# Patient Record
Sex: Female | Born: 1960
Health system: Southern US, Community
[De-identification: ages and names within clinical notes are randomized; demographics above are authoritative.]

## PROBLEM LIST (undated history)

## (undated) DIAGNOSIS — C539 Malignant neoplasm of cervix uteri, unspecified: Secondary | ICD-10-CM

## (undated) DIAGNOSIS — F419 Anxiety disorder, unspecified: Secondary | ICD-10-CM

## (undated) HISTORY — PX: LAPAROSCOPIC RADICAL TOTAL HYSTERECTOMY W/ NODE BIOPSY: SHX1934

## (undated) HISTORY — DX: Malignant neoplasm of cervix uteri, unspecified: C53.9

---

## 1999-01-27 ENCOUNTER — Other Ambulatory Visit: Admission: RE | Admit: 1999-01-27 | Discharge: 1999-01-27 | Payer: Self-pay | Admitting: Gynecology

## 1999-03-24 ENCOUNTER — Other Ambulatory Visit: Admission: RE | Admit: 1999-03-24 | Discharge: 1999-03-24 | Payer: Self-pay | Admitting: Gynecology

## 1999-06-22 ENCOUNTER — Ambulatory Visit (HOSPITAL_COMMUNITY): Admission: RE | Admit: 1999-06-22 | Discharge: 1999-06-22 | Payer: Self-pay | Admitting: *Deleted

## 1999-06-22 ENCOUNTER — Encounter: Payer: Self-pay | Admitting: *Deleted

## 1999-07-09 ENCOUNTER — Encounter: Payer: Self-pay | Admitting: *Deleted

## 1999-07-09 ENCOUNTER — Ambulatory Visit (HOSPITAL_COMMUNITY): Admission: RE | Admit: 1999-07-09 | Discharge: 1999-07-09 | Payer: Self-pay | Admitting: *Deleted

## 2000-03-02 ENCOUNTER — Other Ambulatory Visit: Admission: RE | Admit: 2000-03-02 | Discharge: 2000-03-02 | Payer: Self-pay | Admitting: Gynecology

## 2001-03-20 ENCOUNTER — Other Ambulatory Visit: Admission: RE | Admit: 2001-03-20 | Discharge: 2001-03-20 | Payer: Self-pay | Admitting: Gynecology

## 2002-03-21 ENCOUNTER — Other Ambulatory Visit: Admission: RE | Admit: 2002-03-21 | Discharge: 2002-03-21 | Payer: Self-pay | Admitting: Gynecology

## 2002-04-19 ENCOUNTER — Other Ambulatory Visit: Admission: RE | Admit: 2002-04-19 | Discharge: 2002-04-19 | Payer: Self-pay | Admitting: Gynecology

## 2002-06-18 ENCOUNTER — Other Ambulatory Visit: Admission: RE | Admit: 2002-06-18 | Discharge: 2002-06-18 | Payer: Self-pay | Admitting: Gynecology

## 2003-03-20 ENCOUNTER — Other Ambulatory Visit: Admission: RE | Admit: 2003-03-20 | Discharge: 2003-03-20 | Payer: Self-pay | Admitting: Gynecology

## 2003-10-01 ENCOUNTER — Other Ambulatory Visit: Admission: RE | Admit: 2003-10-01 | Discharge: 2003-10-01 | Payer: Self-pay | Admitting: Gynecology

## 2003-12-27 ENCOUNTER — Ambulatory Visit (HOSPITAL_COMMUNITY): Admission: RE | Admit: 2003-12-27 | Discharge: 2003-12-27 | Payer: Self-pay | Admitting: Gynecology

## 2004-04-08 ENCOUNTER — Other Ambulatory Visit: Admission: RE | Admit: 2004-04-08 | Discharge: 2004-04-08 | Payer: Self-pay | Admitting: Gynecology

## 2005-02-02 ENCOUNTER — Encounter: Admission: RE | Admit: 2005-02-02 | Discharge: 2005-02-02 | Payer: Self-pay | Admitting: Gastroenterology

## 2005-04-19 ENCOUNTER — Encounter (INDEPENDENT_AMBULATORY_CARE_PROVIDER_SITE_OTHER): Payer: Self-pay | Admitting: *Deleted

## 2005-04-19 ENCOUNTER — Ambulatory Visit (HOSPITAL_COMMUNITY): Admission: RE | Admit: 2005-04-19 | Discharge: 2005-04-19 | Payer: Self-pay | Admitting: Gastroenterology

## 2005-05-05 ENCOUNTER — Other Ambulatory Visit: Admission: RE | Admit: 2005-05-05 | Discharge: 2005-05-05 | Payer: Self-pay | Admitting: Gynecology

## 2006-01-26 ENCOUNTER — Ambulatory Visit: Payer: Self-pay | Admitting: Family Medicine

## 2006-03-01 HISTORY — PX: ABDOMINAL HYSTERECTOMY: SHX81

## 2006-07-04 ENCOUNTER — Other Ambulatory Visit: Admission: RE | Admit: 2006-07-04 | Discharge: 2006-07-04 | Payer: Self-pay | Admitting: Gynecology

## 2006-08-09 ENCOUNTER — Other Ambulatory Visit: Admission: RE | Admit: 2006-08-09 | Discharge: 2006-08-09 | Payer: Self-pay | Admitting: Gynecology

## 2006-10-19 ENCOUNTER — Ambulatory Visit: Admission: RE | Admit: 2006-10-19 | Discharge: 2006-10-19 | Payer: Self-pay | Admitting: Gynecology

## 2006-10-21 ENCOUNTER — Inpatient Hospital Stay (HOSPITAL_COMMUNITY): Admission: RE | Admit: 2006-10-21 | Discharge: 2006-10-25 | Payer: Self-pay | Admitting: Gynecology

## 2006-10-21 ENCOUNTER — Encounter (INDEPENDENT_AMBULATORY_CARE_PROVIDER_SITE_OTHER): Payer: Self-pay | Admitting: Gynecology

## 2006-11-18 ENCOUNTER — Ambulatory Visit: Admission: RE | Admit: 2006-11-18 | Discharge: 2006-11-18 | Payer: Self-pay | Admitting: Gynecology

## 2006-12-23 ENCOUNTER — Ambulatory Visit (HOSPITAL_COMMUNITY): Admission: RE | Admit: 2006-12-23 | Discharge: 2006-12-23 | Payer: Self-pay | Admitting: Gynecology

## 2006-12-29 ENCOUNTER — Other Ambulatory Visit: Admission: RE | Admit: 2006-12-29 | Discharge: 2006-12-29 | Payer: Self-pay | Admitting: Gynecology

## 2007-01-20 IMAGING — RF DG ESOPHAGUS
8 series · 20 of 24 positions shown · non-contrast
Comparison: None.

CLINICAL DATA: Dysphagia.
 BARIUM ESOPHAGRAM:

[Series 1: run · 5 of 7 slices shown (1 of 8)]
[im 1/7]
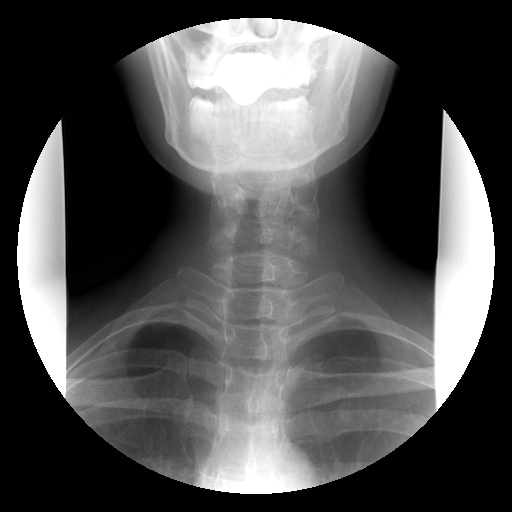
[im 2/7]
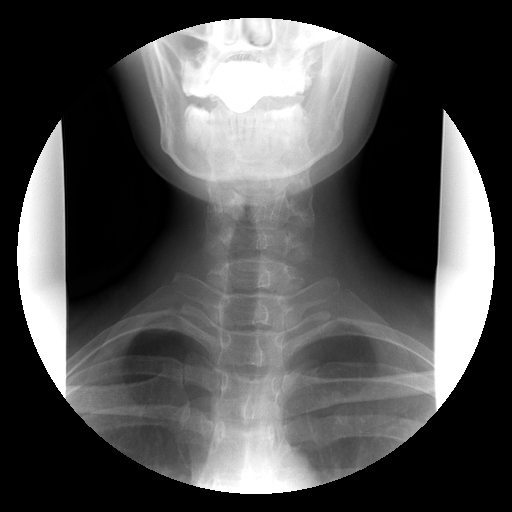
[im 4/7]
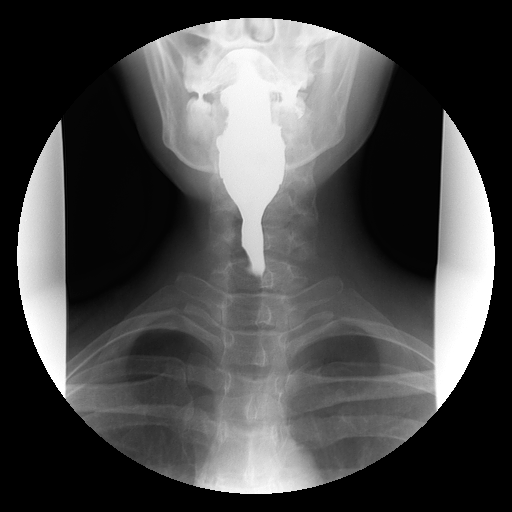
[im 5/7]
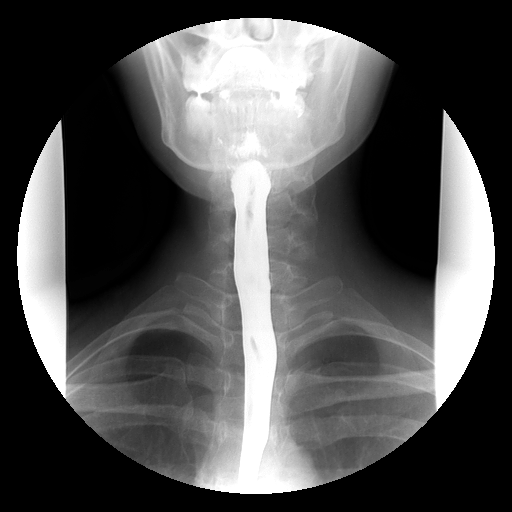
[im 7/7]
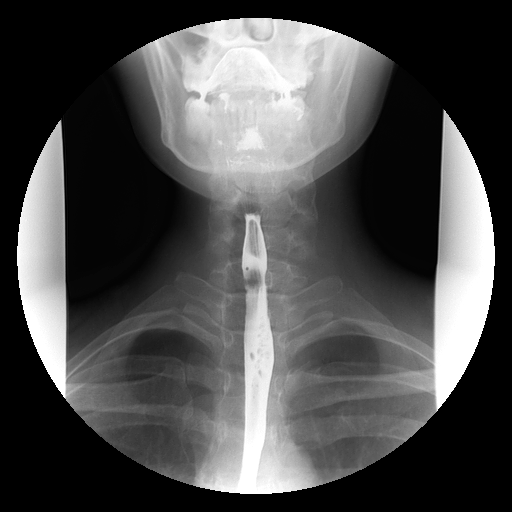

[Series 2: run · 5 of 8 slices shown (2 of 8)]
[im 1/8]
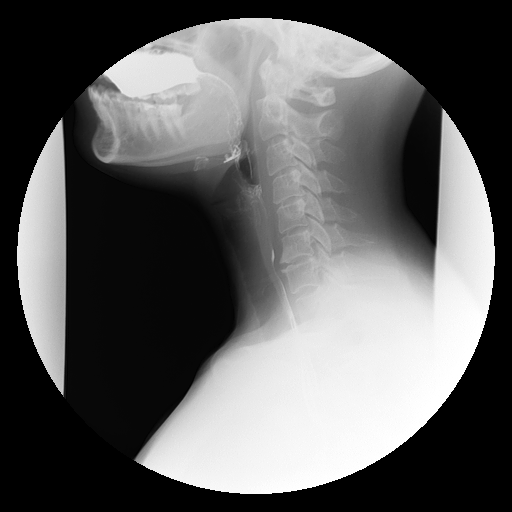
[im 2/8]
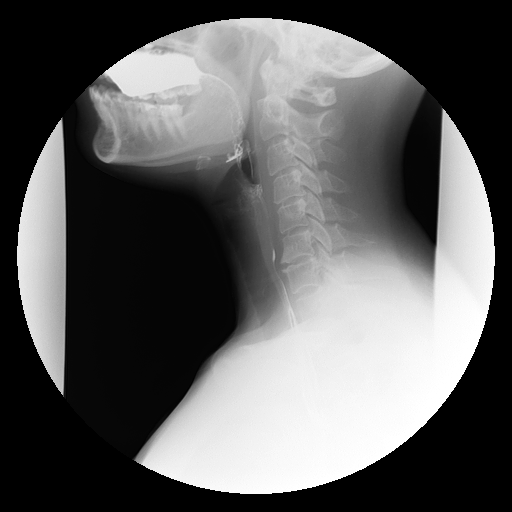
[im 5/8]
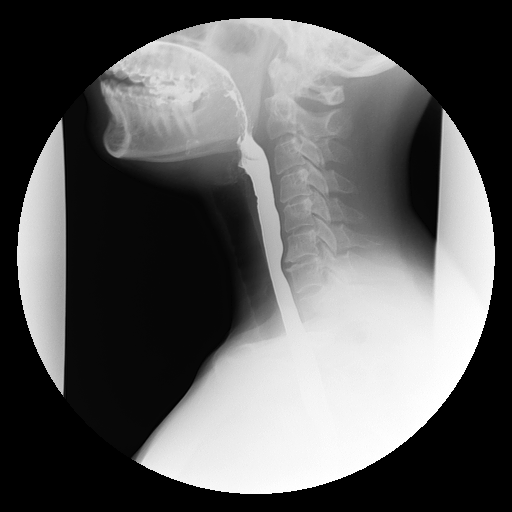
[im 6/8]
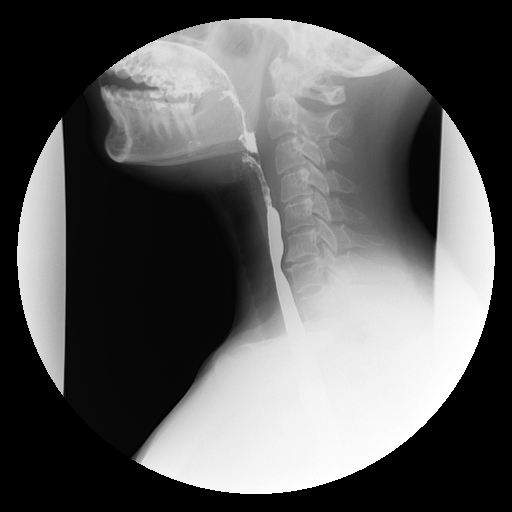
[im 8/8]
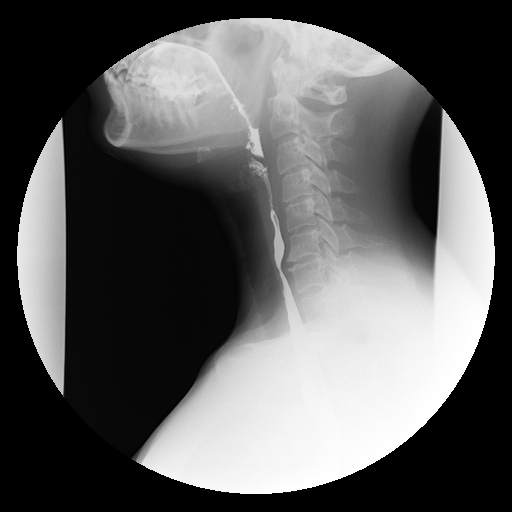

[Series 3: run · 3 of 5 slices shown (3 of 8)]
[im 1/5]
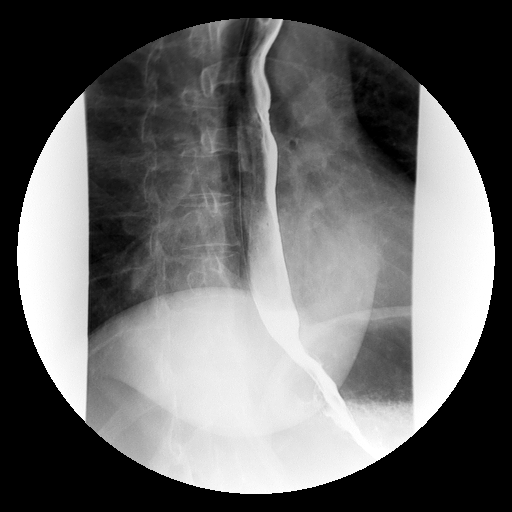
[im 2/5]
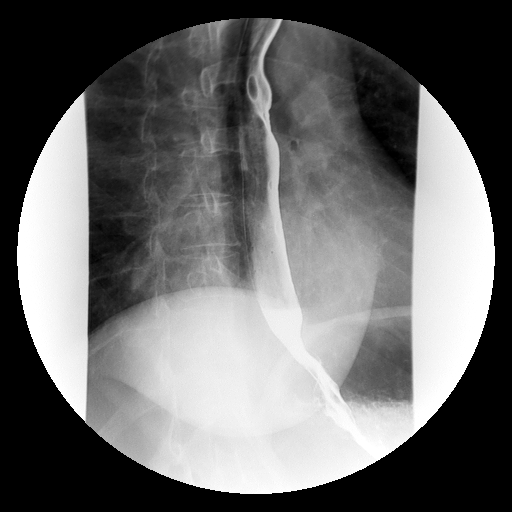
[im 5/5]
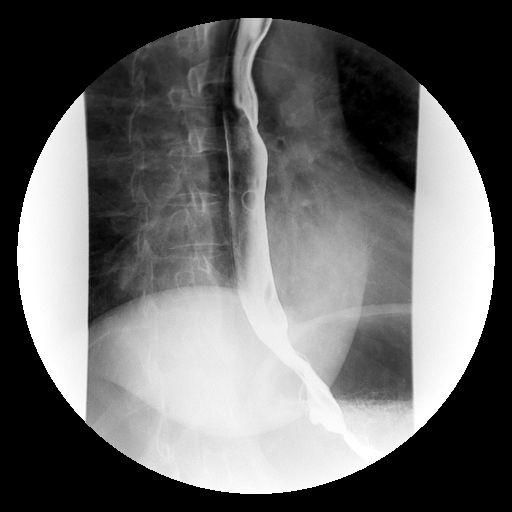

[Series 4: run · 2 of 3 slices shown (4 of 8)]
[im 1/3]
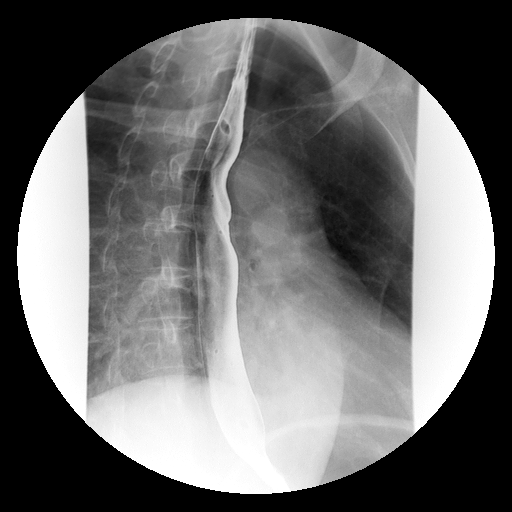
[im 3/3]
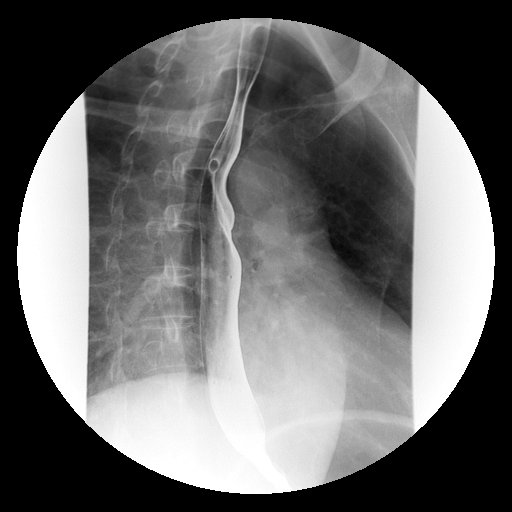

[Series 5: run · 2 of 4 slices shown (5 of 8)]
[im 1/4]
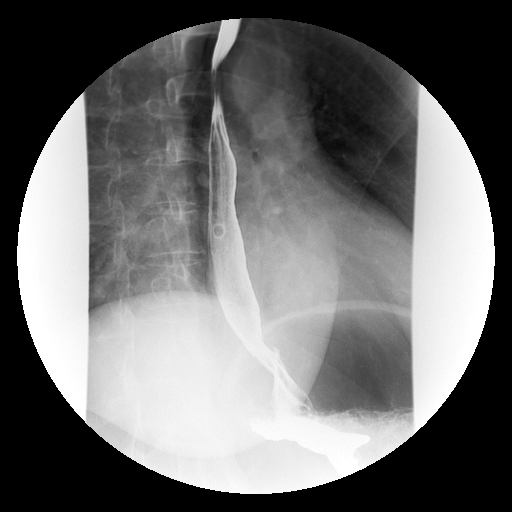
[im 2/4]
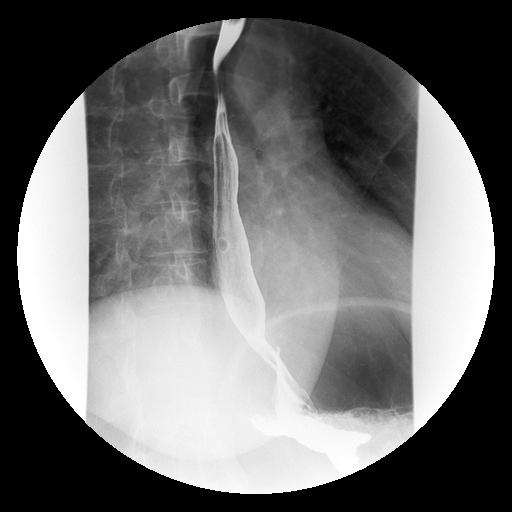

[Series 6: run · 1 of 1 slices shown (6 of 8)]
[im 1/1]
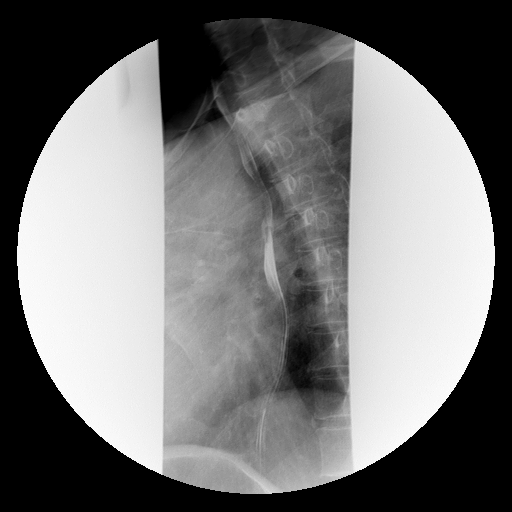

[Series 7: run · 1 of 1 slices shown (7 of 8)]
[im 1/1]
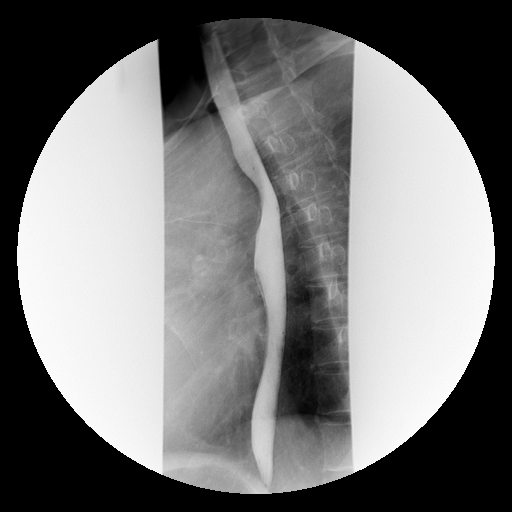

[Series 8: run · 1 of 1 slices shown (8 of 8)]
[im 1/1]
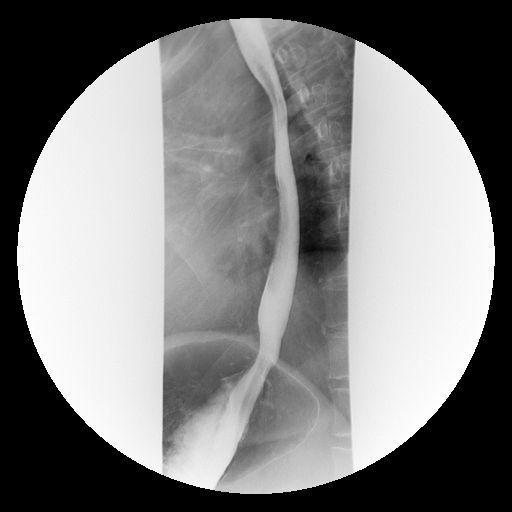

[20 of 24 positions shown; findings below may reference images not displayed]

FINDINGS: Double contrast barium esophagram shows no evidence for mucosal ulceration, stricture, or diverticulum.  Patient was noted to have anterior spurs around the C6-7 disk and these generate mass effect on the posterior esophagus at this level.  No evidence for hiatal hernia.  No esophageal fold thickening to suggest esophagitis.  
 RAO prone positioning demonstrates normal esophageal motility.
IMPRESSION: Anterior spurs at the C5-6 level generate mild mass effect on the posterior esophagus, but the exam is otherwise normal.

## 2008-10-05 IMAGING — CR DG CHEST 2V
2 series · 2 of 2 positions shown · non-contrast
Comparison: none
 The lungs are clear and the heart and mediastinal structures are normal.

CLINICAL DATA: Cervical cancer.  Preoperative chest.  
 [id number] VIEWS:

[view not recorded (1 of 2)]
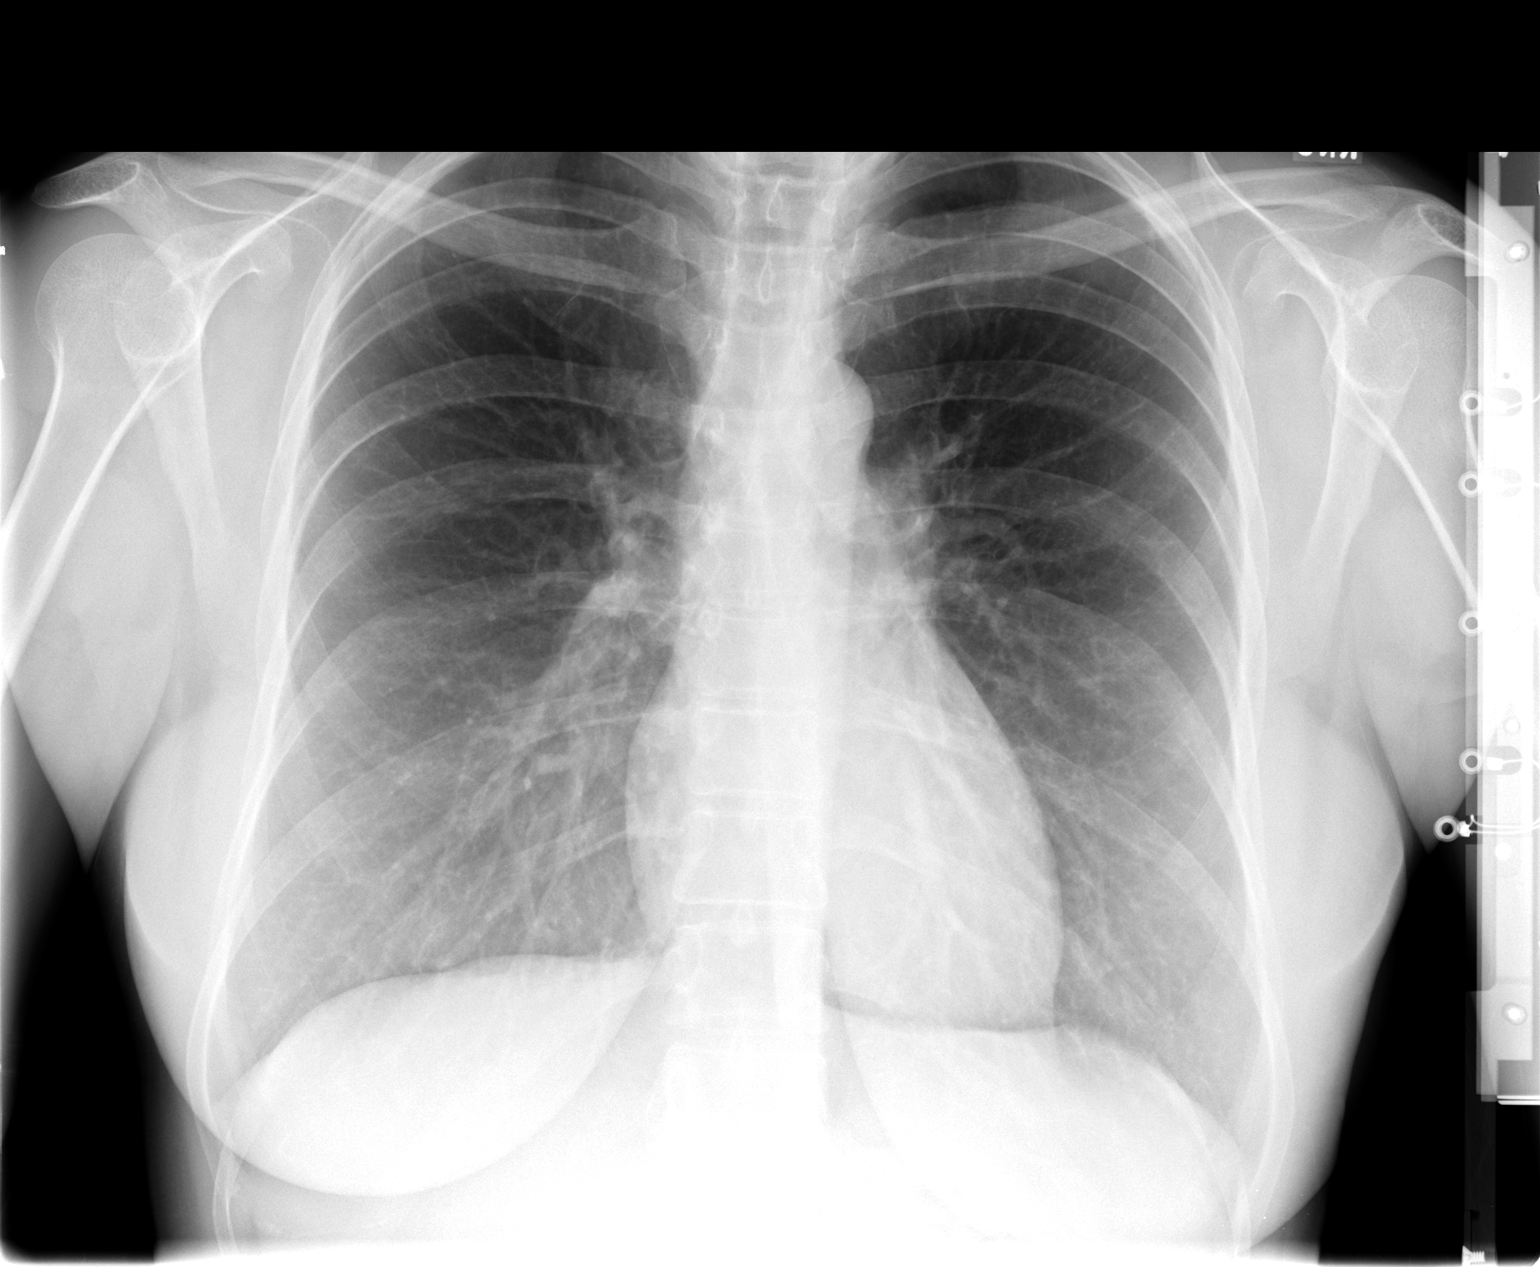

[view not recorded (2 of 2)]
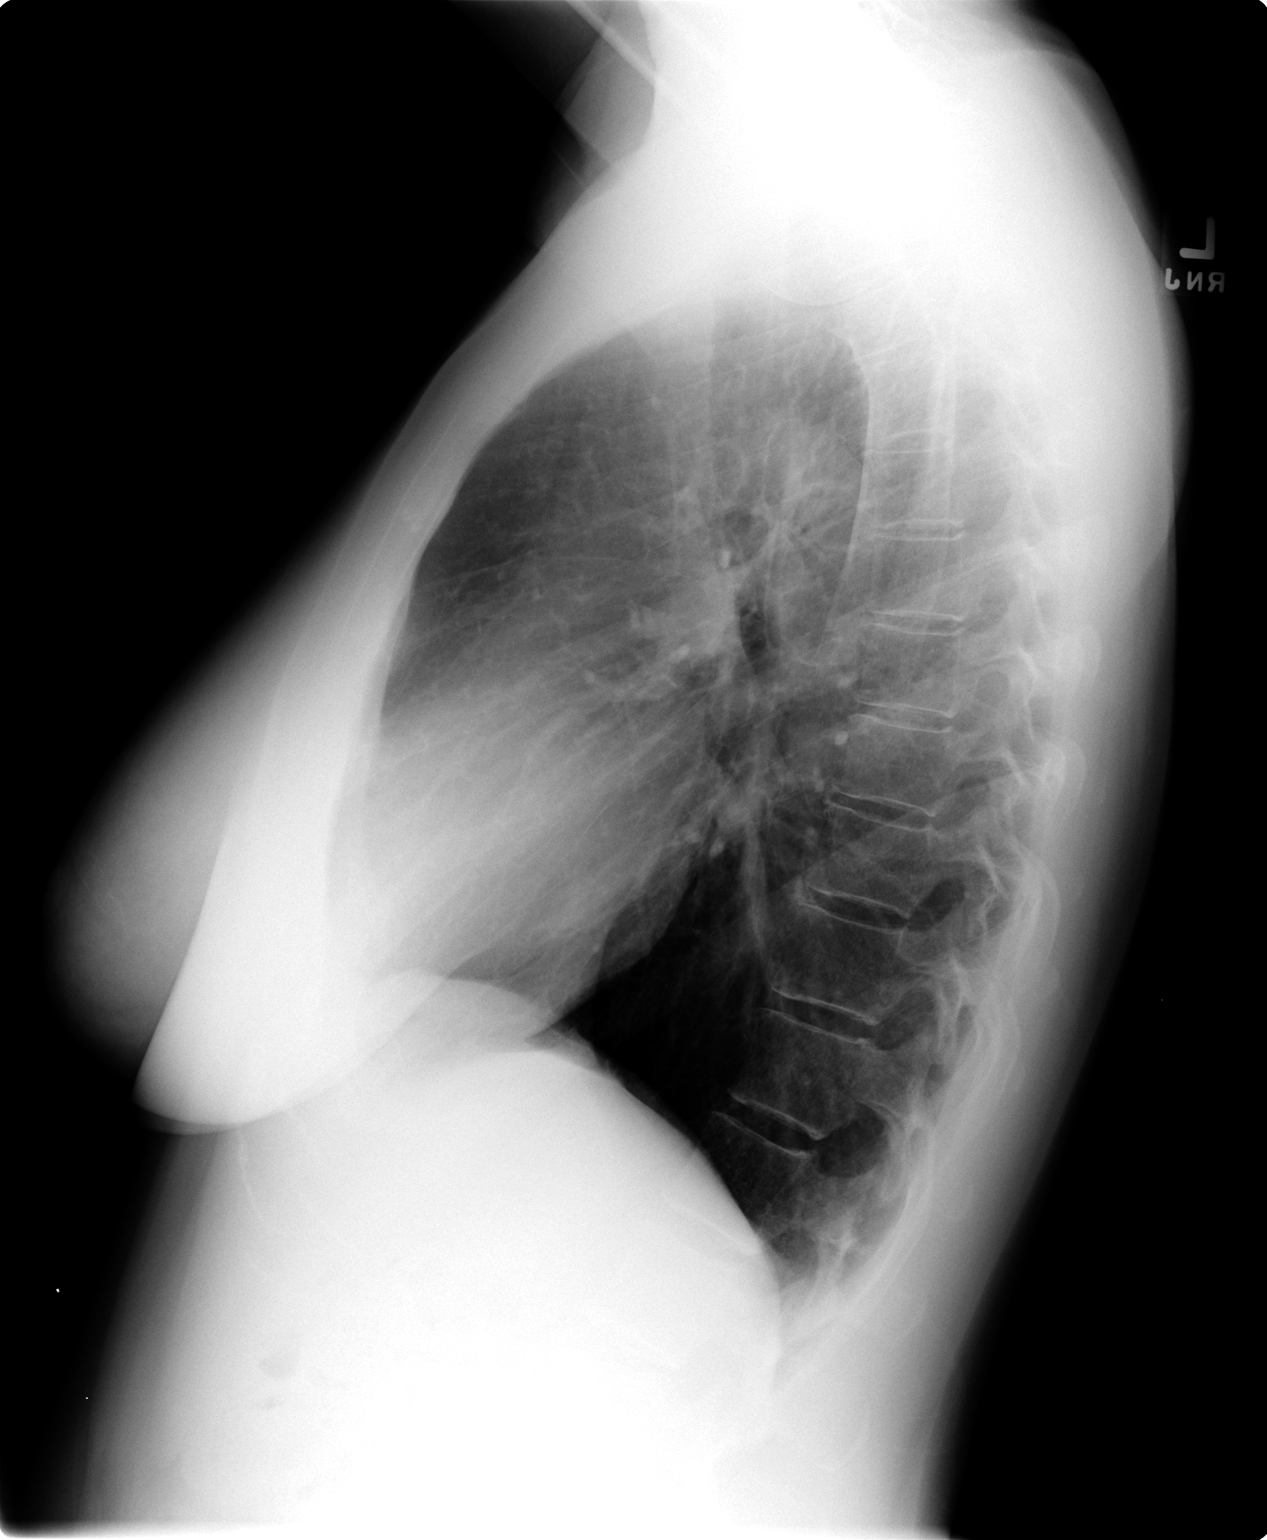

[2 of 2 positions shown; findings below may reference images not displayed]

IMPRESSION: No evidence for active chest disease.

## 2009-05-02 ENCOUNTER — Ambulatory Visit (HOSPITAL_COMMUNITY): Admission: RE | Admit: 2009-05-02 | Discharge: 2009-05-02 | Payer: Self-pay | Admitting: Gynecology

## 2010-07-14 NOTE — Consult Note (Signed)
[name], [name]                 ACCOUNT NO.:  [id number]   MEDICAL RECORD NO.:  [id number]          PATIENT TYPE:  OUT   LOCATION:  GYN                          FACILITY:  [facility]   PHYSICIAN:  De Blanch, M.D.DATE OF BIRTH:  1961-01-02   DATE OF CONSULTATION:  10/19/2006  DATE OF DISCHARGE:                                 CONSULTATION   CHIEF COMPLAINT:  Cervical cancer.   HISTORY OF PRESENT ILLNESS:  The patient underwent a LEEP procedure on  September 19, 2006 for evaluation of a high-grade dysplastic Pap smear and  biopsies.  Final pathology on the LEEP procedure showed an invasive  squamous cell carcinoma (stage IA1). She had 0.2 cm of width and 0.07 cm  in maximal depth.  Surprisingly there was lymph vascular space invasion  despite this very superficially invasive lesion.  The patient has had an  uncomplicated postoperative course and presents today for further  consultation and treatment planning.   PAST MEDICAL HISTORY:  Medical illnesses none.   PAST SURGICAL HISTORY:  Arthroscopic reconstruction of her right knee  (ACL injury).   DRUG ALLERGIES:  None.   CURRENT MEDICATIONS:  Paxil, Femcon and multivitamins.   OBSTETRICAL HISTORY:  Gravida 25, 75 year old child.   SOCIAL HISTORY:  The patient is single.  She does not smoke.  She works  for an [employer] in [employer].   FAMILY HISTORY:  Negative for gynecologic, breast or colon cancer.   PHYSICAL EXAM:  Weight 154 pounds, blood pressure 110/70, pulse 80,  respiratory 20.  GENERAL:  The patient is a healthy white female in no acute distress.  HEENT:  Negative.  NECK:  Supple without thyromegaly.  There is no supraclavicular or  inguinal adenopathy.  ABDOMEN:  The abdomen is soft, nontender.  No mass, organomegaly,  ascites or hernias are noted.  PELVIC:  EGBUS, vagina, bladder, urethra are normal. The cervix is  healing well. There is a small ectropion present, no evidence of  infection or  purulent discharge is noted. On bimanual examination, the  uterus is anterior, normal shape, size and consistency. There is no  adnexal masses noted.  Rectovaginal exam confirms.  I do not feel any  parametrial involvement.   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  This is a most unusual lesion. Given its  superficial nature, it is surprising there is involvement of lymph  vascular spaces this close to the cervical mucosa.  Nonetheless, I  believe she has a higher risk of having lymph node metastases and  therefore in consultation with Dr. Nicholas [name] we have agreed to treat the  patient with a total abdominal hysterectomy, bilateral salpingo-  oophorectomy and selective pelvic lymphadenectomy.  If the disease is  confined to the cervix or there is no residual disease, I do not believe  any adjuvant therapy would be necessary or advised.  The patient also  understands that if a lymphadenectomy reveals metastatic disease, she  will need to be treated with postoperative radiation therapy. I think  the odds of this  are very unlikely.  The risks of surgery including  hemorrhage, infection, injury to adjacent viscera, thromboembolic  complications and anesthetic risks were outlined to the patient. She is  aware of the potential for genital femoral nerve injury or numbness  beneath a Pfannenstiel incision. All of her questions are answered and  she wishes to go ahead with surgery which was scheduled for this Friday.  She will undergo preoperative evaluation today.      De Blanch, M.D.  Electronically Signed     DC/MEDQ  D:  10/19/2006  T:  10/20/2006  Job:  [id number]   cc:   [name], M.D.  Fax: [phone]   [name], R.N.  [address]  [place], [place] [address]

## 2010-07-14 NOTE — Consult Note (Signed)
[name], [name]                 ACCOUNT NO.:  [id number]   MEDICAL RECORD NO.:  [id number]          PATIENT TYPE:  OUT   LOCATION:  GYN                          [facility]ACILITY[facility]   PHYSICIAN:  De Blanch, M.D.DATE OF BIRTH:  [birth year]   DATE OF CONSULTATION:  DATE OF DISCHARGE:                                 CONSULTATION   CHIEF COMPLAINT:  Postoperative followup.   INTERVAL HISTORY:  The patient underwent a total abdominal hysterectomy  and pelvic lymphadenectomy on October 21, 2006, for treatment of a stage  I A1 squamous cell carcinoma of the cervix with lymph vascular space  invasion.  Final pathology showed no residual disease in the cervix and  all lymph nodes were free of disease.  The patient has had an  uncomplicated postoperative course except for some vulvar and lower  abdomen fluid retention which is now resolved.  The patient saw Dr.  Nicholas Lose yesterday.  She reports that over the past week she has improved  significantly in all of her symptoms.  She has no GI or GU symptoms at  the present time.  Her functional status is improving steadily.   PHYSICAL EXAMINATION:  VITAL SIGNS:  Weight 150 pounds.  ABDOMEN:  Soft and nontender.  Pfannenstiel incision is healing nicely.  There is some postoperative induration as would be expected.   Given the fact the patient had a pelvic exam yesterday by Dr. Nicholas Lose, it  will not be repeated today.   IMPRESSION:  I reviewed the patient's pathology report with her.  It  indicated that the prognosis is excellent and the risk of recurrence is  significantly small.   I would suggest she be followed by Dr. Nicholas Lose with a Pap smear at every  visit, explaining that the risk of recurrence in the vagina could be  detected by performing Pap smears.  She has a previously scheduled  appointment with Dr. Nicholas Lose in 2 months and then I would suggest that she  be seen every 3-4 months for the 1st year of followup and every 6 months  thereafter.  I would be happy to see the patient back if she has any  difficulties or problems.      De Blanch, M.D.  Electronically Signed     DC/MEDQ  D:  11/18/2006  T:  11/18/2006  Job:  [id number]   cc:   Gretta Cool, M.D.  Fax: [phone]   Telford Nab, R.N.  [address]  [place], [place] [address]

## 2010-07-14 NOTE — H&P (Signed)
[name], [name]                 ACCOUNT NO.:  [id number]   MEDICAL RECORD NO.:  [id number]          PATIENT TYPE:  INP   LOCATION:  NA                           FACILITY:  [facility]   PHYSICIAN:  Gretta Cool, M.D. DATE OF BIRTH:  01/21/1961   DATE OF ADMISSION:  10/19/2006  DATE OF DISCHARGE:                              HISTORY & PHYSICAL   CHIEF COMPLAINT:  Invasive carcinoma of the cervix, stage IA1 with 2-mm  width the invasion and less than 1-mm depth of invasion but with  significant microvascular invasion.   HISTORY OF PRESENT ILLNESS:  A 50 year old G1, P1, admitted for  definitive therapy of stage IA1 with microinvasion as above.  Although  she has very scant extent of disease, there is significant risk of node  involvement.  I have arranged definitive therapy with Dr. Katheren Shams-  [facility] for [facility]On[facility] at [facility] for definitive therapy by  hysterectomy and pelvic lymphadenectomy.  She has had normal Pap smears  on an annual basis since 2004.  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  She has had 3 normal Pap smears in a row following negative  workup.  And, then normal Paps on an annual basis since that time.  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  She had colposcopy and  directed biopsies and had a high grade squamous intraepithelial lesion  CIN-3.  She subsequently had LEEP excision of the entire transformation  zone.  All of the margins were clear.  She had on LEEP cone a 2-mm width  of invasion and 0.7-mm depth of invasion.  It was supposed she had  significant microvascular involvement.  She is now admitted for  definitive therapy as planned.  I have reviewed the pathology with Dr.  Sherryll Burger and agree that she needs more than simple hysterectomy.  She is now  admitted for management with Dr. Serita Kyle.   PAST MEDICAL HISTORY:  Usual childhood disease without  sequelae.   MEDICAL ILLNESSES:  None.   PREVIOUS HOSPITALIZATION:  1. Knee surgery.  2. Vaginal delivery x1.   PRESENT MEDICATIONS:  Hormonal contraception.   FAMILY HISTORY:  Father has type 2 diabetes.  Paternal grandfather had  lung cancer.  Paternal grandmother liver cancer.  Father also had  hypertension and no other known familial tendency.   HABITS:  Denies ethanol, tobacco, recreational drugs.   SOCIAL HISTORY:  The patient is a Psychologist, forensic.  She is divorced, has  a teenage child at home.  She has good family support system.   REVIEW OF SYSTEMS:  HEENT:  Denies symptoms.  CARDIORESPIRATORY:  Denies  asthma, cough, bronchitis, shortness of breath. GI/GU:  Denies  frequency, urgency, dysuria, change in bowel habits, food intolerance.   PHYSICAL EXAMINATION:  GENERAL:  Well developed, well nourished, white  female.  VITAL SIGNS:  Height 5 feet 6 inches, weight 150 pounds.  Blood pressure  is 120/78.  HEENT:  Pupils equal reactive to light, accommodate.  Fundi not  examined.  Oropharynx clear.  NECK:  Supple without mass, thyroid enlargement.  CHEST:  Clear to percussion auscultation.  HEART:  Regular rhythm without murmur or cardiac enlargement.  BREASTS:  Soft without mass, nodes, or nipple discharge.  ABDOMEN:  Soft, scaphoid without mass, organomegaly.  PELVIC:  External genitalia normal female.  Vagina clean, rugose.  The  cervix is parous without visible lesions prior to LEEP excision.  Her  cone is well healed.  Uterus is normal size, shape, contour.  Adnexa  clear.  Rectovaginal confirms.   IMPRESSION:  1. Invasive squamous cell carcinoma of the cervix, stage IA1 with      significant vascular invasion.  2. Normal Pap smears proceeding this high grade change.           ______________________________  Gretta Cool, M.D.     CWL/MEDQ  D:  10/20/2006  T:  10/20/2006  Job:  [id number]

## 2010-07-14 NOTE — Op Note (Signed)
[name], [name]                 ACCOUNT NO.:  [id number]   MEDICAL RECORD NO.:  [id number]          PATIENT TYPE:  INP   LOCATION:  1537                         FACILITY:  [facility]   PHYSICIAN:  De Blanch, M.D.DATE OF BIRTH:  10/11/1960   DATE OF PROCEDURE:  10/21/2006  DATE OF DISCHARGE:  10/19/2006                               OPERATIVE REPORT   PREOPERATIVE DIAGNOSIS:  Stage I-A squamous cell carcinoma of the cervix  with lymph vascular space invasion.   POSTOPERATIVE DIAGNOSIS:  Stage I-A squamous cell carcinoma of the  cervix with lymph vascular space invasion.   PROCEDURE:  Total abdominal hysterectomy, pelvic lymphadenectomy.   SURGEON:  De Blanch, M.D.   ASSISTANT:  Gretta Cool, M.D.  Telford Nab, R.N.   ANESTHESIA:  General with endotracheal tube.   ESTIMATED BLOOD LOSS:  100 mL.   SURGICAL FINDINGS:  On exploratory laparotomy, the uterus, tubes, and  ovaries appeared normal.  There were no enlarged lymph nodes.  The upper  abdomen was normal.  Once the uterus was removed, the cervix appeared  normal grossly and was not enlarged and had healed well from the prior  LEEP procedure.   DESCRIPTION OF PROCEDURE:  The patient was brought to the operating room  and after satisfactory attainment of general anesthesia, was placed in a  modified lithotomy position in Rodeo stirrups.  The anterior abdominal  wall, perineum, and vagina were prepped with Betadine.  A Foley catheter  was inserted and the patient was draped.  The abdomen was entered  through a Pfannenstiel incision.  The upper abdomen and pelvis were  explored with the above noted findings.  The Bookwalter retractor was  positioned and the bowel was packed out of the pelvis.   The uterus was grasped with long Kelly clamps.  The round ligaments were  divided and the peritoneum incised lateral to the infundibulopelvic  ligament.  The retroperitoneal spaces were opened identifying  the  vessels and the ureter.  The ovarian vessels were identified. The  ovarian ligament and fallopian tube were cross clamped adjacent to the  cornua of the uterus, divided, free tied, and suture ligated thus  preserving the tubes and ovaries.  These were packed behind the  retractors.  The bladder flap was advanced with sharp and blunt  dissection.  The uterine vessels were skeletonized, clamped, cut and  suture ligated.  The rectovaginal septum was opened, the paracervical  and paravaginal tissues were clamped, cut, and suture ligated.  The  vaginal angles were identified, cross clamped, and divided, and the  vagina transected just beneath the cervix.  The cervix is inspected and  found to be entirely removed.  The vaginal angles were closed with  transfixing sutures of 0 Vicryl and the central portion of the vagina  closed with interrupted figure-of-eight sutures of 0 Vicryl.   Attention was turned to performing the pelvic lymphadenectomy.  Lymphadenectomy was performed because of the small possibility of attic  metastases based on pathologic findings of lymph vascular space invasion  in the cervical specimen.  The lymph nodes from the  external iliac  artery and vein were removed from the circumflex iliac vein up to the  bifurcation of the common iliac vein.  Dissection was then carried down  along the medial aspect of the external iliac vein into the obturator  fossa.  Lymph nodes from the obturator fossa were then removed.  The  obturator nerve was identified and mobilized laterally so as to  facilitate a lymph node dissection.  Throughout the dissection on both  sides of the pelvis, care was taken to preserve the genitofemoral nerve,  as well.  On the right side, the genitofemoral nerve seemed to split  into two branches, one branch overlying the external iliac artery.  This  was carefully dissected and pulled laterally throughout the remainder of  the lymph node dissection.    Hemostasis throughout the lymph node dissection was achieved with  hemoclips and cautery.  The pelvis was irrigated with warm saline and  found to be hemostatic.  The ovaries were then sutured to the round  ligament and lateral pelvic sidewall, thus keeping them from falling  deep into the pelvis and potentially causing dyspareunia.  Packs and  retractors were removed.  The anterior abdominal wall was closed in  layers, the first being running 2-0 Vicryl suture on the peritoneum, the  fascia was closed with a running suture of 0 PDS, the subcutaneous  tissue was reapproximated with 2-0 PDS, the skin was closed with skin  staples.  A dressing was applied.  The patient was awakened from  anesthesia and taken to the recovery room in satisfactory condition.  Sponge, needle, and instrument counts were correct x2.      De Blanch, M.D.  Electronically Signed     DC/MEDQ  D:  10/21/2006  T:  10/22/2006  Job:  [id number]   cc:   Gretta Cool, M.D.  Fax: [phone]   Telford Nab, R.N.  [address]  [place], [place] [address]

## 2010-07-17 NOTE — Op Note (Signed)
[name], [name]                 ACCOUNT NO.:  [id number]   MEDICAL RECORD NO.:  [id number]          PATIENT TYPE:  AMB   LOCATION:  ENDO                         FACILITY:  [facility]   PHYSICIAN:  Anselmo Rod, M.D.  DATE OF BIRTH:  Jan 17, 1961   DATE OF PROCEDURE:  04/19/2005  DATE OF DISCHARGE:                                 OPERATIVE REPORT   PROCEDURE PERFORMED:  Esophagogastroduodenoscopy with small bowel biopsies.   ENDOSCOPIST:  Anselmo Rod, M.D.   INSTRUMENT USED:  Olympus video panendoscope.   INDICATION FOR PROCEDURE:  [age]-year-old white female with a history  of dysphagia, undergoing an EGD as she continues to have difficulty  swallowing at times in attacks, in spite of the use of PPIs.  A barium  swallow showed a mass effect in the posterior esophagus, ? anterior spurs at  C5-C6 level.  The patient has a history of acid reflux and has had  epigastric pain.   PREPROCEDURE PREPARATION:  Informed consent was procured from the patient.  The patient was fasted for 8 hours prior to the procedure.  Risks and  benefits of the procedure were discussed with the patient in great detail.   PREPROCEDURE PHYSICAL:  VITAL SIGNS:  The patient had stable vital signs.  NECK:  Supple.  CHEST:  Clear to auscultation.  CARDIAC:  S1 and S2 regular.  ABDOMEN:  Soft with normal bowel sounds.   DESCRIPTION OF PROCEDURE:  The patient was placed in the left lateral  decubitus position and sedated with 75 mcg of fentanyl and 8 mg of Versed in  slow incremental doses.  Once the patient was adequately sedated and  maintained on low-flow oxygen and continuous cardiac monitoring, the Olympus  video panendoscope was advanced through the mouthpiece, over the tongue,  into the esophagus under direct vision.  The entire esophagus was widely  patent with no evidence of ring, stricture, mass, esophagitis or Barrett's  mucosa.  The scope was then advanced into the stomach and a small  hiatal  hernia was seen on high retroflexion.  The rest of the gastric mucosa  appeared normal.  There were no ulcers, erosions, masses or polyps seen and  on advancing the scope into the proximal small bowel, blunted mucosa was  noted with whitish patches; this was biopsied to rule out celiac sprue.  The  patient tolerated the procedure well without complications.   IMPRESSION:  1.Widely patent esophagus, no abnormalities noted.  2.Normal-appearing stomach.  3.Small hiatal hernia seen.  4.Whitish patches in the proximal small bowel, biopsies done to rule out  sprue.   RECOMMENDATIONS:  1.Await pathology results.  2.Continue PPIs for now.  3.Avoid all nonsteroidals for now.  4.Further recommendations will depend on biopsy results.  5.An esophageal manometry will be done if her dysphagia persists.  6.Avoidance of very hot or very cold liquids has been advised to prevent  esophageal spasm.      Anselmo Rod, M.D.  Electronically Signed     JNM/MEDQ  D:  04/19/2005  T:  04/20/2005  Job:  [id number]   cc:  [name], M.D.  Fax: [phone]   [name], M.D.  Fax: [phone]

## 2010-07-17 NOTE — Discharge Summary (Signed)
[name], [name]                 ACCOUNT NO.:  [id number]   MEDICAL RECORD NO.:  [id number]          PATIENT TYPE:  INP   LOCATION:  1537                         FACILITY:  [facility]   PHYSICIAN:  Gretta Cool, M.D. DATE OF BIRTH:  [birth year]   DATE OF ADMISSION:  10/21/2006  DATE OF DISCHARGE:  10/25/2006                               DISCHARGE SUMMARY   HISTORY OF PRESENT ILLNESS:  [name] is a 50 year old female, gravida  1, para 1, who was admitted for definitive therapy, stage A1  with  microinvasion of the cervix.  Although she has scant extent of the  disease, there was a significant risk of node involvement.  By pathology  report, she has invasive carcinoma of the cervix, stage A1 with 2 mm  width of invasion and less than 1 mm depth of invasion but with  significant microvascular invasion.  Consultation with Dr. Katheren Shams-  Pearson was arranged, and surgery will be done with him.  She will have  definitive therapy by hysterectomy and pelvic lymphadenectomy.  She has  had normal Pap smears on annual exam since 2004.  That year, she had  glandular cell abnormality.  Since that time, she has had three normal  Pap smears in a row with negative workup.  On Jul 04, 2006, the Pap smear  showed glandular cell abnormality again.  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  Colposcopy-directed  biopsies showed high grade squamous intra-epithelial lesions, CIN-III.  She subsequently had a LEEP excision of the entire transformation zone,  and all margins were clear.  On LEEP, she had a 2 mm width of invasion  and a 0.7 mm depth of invasion.  It was supposed at that time that she  had significant microvascular involvement, and now is admitted for  definitive therapy.  Pathology was reviewed with Dr. Clelia Croft, and it was  agreed that she needed more than simple hysterectomy.  She is now  admitted for management with Dr. De Blanch.   ADMISSION  EXAMINATION:  Well-developed and well-nourished white female.  HEENT:  Without findings.  Thyroid was not enlarged.  CHEST:  Clear to A&P.  HEART:  Rate and rhythm were regular without murmur, gallop, or cardiac  enlargement.  BREASTS:  Soft without masses, nodes, or nipple discharge.  ABDOMEN:  Soft and scaphoid without masses or organomegaly.  PELVIC:  External genitalia within normal limits for female.  Vagina was  clean and rugose.  Cervix is parous without visible lesions prior to  LEEP excision.  This is well healed.  The uterus is normal in shape,  size, and contour.  The adnexa is bilaterally clear.  The rectovaginal  exam confirms.   IMPRESSION:  1. 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  2. Normal Pap smears proceeding this high-grade change, cervical      intraepithelial neoplasia III with invasion.   PLAN:  As mentioned above.   LABS:  Preoperative labs:  Hemoglobin was 13.4, hematocrit 38.9.  The  remainder of her preoperative labs were within normal limits with the  exception of a slightly elevated potassium or an elevated potassium at  5.4.  Her postoperative labs, hemoglobin 11.4, hematocrit 32.9.  Her CO2  was 34.  Preop urinary pregnancy test was negative.  Her ABO Rh is A+  with a negative antibody screen.   CHEST X-RAY:  Lungs were clear.  Heart and mediastinal structures are  normal.   HOSPITAL COURSE:  Patient underwent abdominal hysterectomy with pelvic  lymph node dissection under general anesthesia.  The procedure was  performed by Dr. De Blanch and Dr. Beather Arbour.  The  procedures were completed without any complication.  The patient was  returned to the recovery room in excellent condition.  Pathology report  showed the cervix with biopsy site changes and no residual carcinoma or  squamous intraepithelial lesion.  Benign weekly secretory endometrium  and focal adenomyosis.  Left  pelvis lymph nodes, no tumor identified in  four nodes.  The right pelvic lymph node excision, no tumor identified  in eight nodes.   Her postoperative course was complicated by a lot of itching with the  PCA Dilaudid.  That medication was changed.  Her itching resolved.  She  also complained of abdominal cramping.  A Dulcolax suppository was given  with relief.  It did require a Fleet enema for complete relief.   On the following day, she again had difficulty with abdominal distention  and gas.  She again received a Fleet enema and had some relief with a  small bowel movement.  She continued to improve and was discharged on  August 26 in excellent condition.   Final discharge instructions included no heavy lifting or straining.  No  vaginal entrance.  Increase ambulation as tolerated.  She is to call for  any fever of over 100.4 or failure of daily improvement.   DIET:  Regular.   MEDICATIONS:  1. Celebrex 200 mg 1 p.o. daily.  2. Percocet 5/325 2 every 4 hours for severe pain.   She is to return to the office in 72 hours for followup.   FINAL DISCHARGE DIAGNOSES:  1. 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  2. No lymph involvement per pathology report.   PROCEDURES PERFORMED:  Abdominal hysterectomy with pelvic lymph node  dissection under general anesthesia.      Matt Holmes, N.P.    ______________________________  Gretta Cool, M.D.    EMK/MEDQ  D:  12/07/2006  T:  12/07/2006  Job:  [id number]   cc:   De Blanch, M.D.  [address]  [place]  [place] [address]

## 2010-12-11 LAB — COMPREHENSIVE METABOLIC PANEL
ALT: 14
AST: 19
Albumin: 3.5
Alkaline Phosphatase: 49
BUN: 11
CO2: 31
Calcium: 9.8
Chloride: 106
Creatinine, Ser: 0.79
GFR calc Af Amer: >60
GFR calc non Af Amer: >60
Glucose, Bld: 84
Potassium: 5.4 — ABNORMAL HIGH
Sodium: 144
Total Bilirubin: 0.6
Total Protein: 6.4

## 2010-12-11 LAB — TYPE AND SCREEN
ABO/RH(D): A POS
Antibody Screen: NEGATIVE

## 2010-12-11 LAB — CBC
HCT: 38.9
Hemoglobin: 13.4
MCHC: 34.6
MCV: 92.2
Platelets: 225
RBC: 3.56 — ABNORMAL LOW
RBC: 4.22
RDW: 12.8
WBC: 6.2
WBC: 7.5

## 2010-12-11 LAB — ELECTROLYTE PANEL
CO2: 34 — ABNORMAL HIGH
Chloride: 105
Potassium: 4.1
Sodium: 141

## 2010-12-11 LAB — DIFFERENTIAL
Eosinophils Absolute: 0.1
Lymphs Abs: 1.9
Monocytes Relative: 6
Neutro Abs: 3.8
Neutrophils Relative %: 60

## 2010-12-25 ENCOUNTER — Other Ambulatory Visit: Payer: Self-pay | Admitting: Gynecology

## 2011-12-28 ENCOUNTER — Other Ambulatory Visit: Payer: Self-pay | Admitting: Gynecology

## 2012-12-25 ENCOUNTER — Other Ambulatory Visit (HOSPITAL_COMMUNITY): Payer: Self-pay | Admitting: Gynecology

## 2012-12-25 DIAGNOSIS — Z1231 Encounter for screening mammogram for malignant neoplasm of breast: Secondary | ICD-10-CM

## 2013-01-03 ENCOUNTER — Ambulatory Visit (HOSPITAL_COMMUNITY)
Admission: RE | Admit: 2013-01-03 | Discharge: 2013-01-03 | Disposition: A | Discharge status: Home / Self Care | Payer: BC Managed Care – PPO | Source: Ambulatory Visit | Attending: Gynecology | Admitting: Gynecology

## 2013-01-03 DIAGNOSIS — Z1231 Encounter for screening mammogram for malignant neoplasm of breast: Secondary | ICD-10-CM | POA: Insufficient documentation

## 2013-01-03 LAB — HM MAMMOGRAPHY

## 2013-03-01 HISTORY — PX: COLONOSCOPY: SHX174

## 2013-04-06 ENCOUNTER — Telehealth: Payer: Self-pay

## 2013-04-06 NOTE — Telephone Encounter (Addendum)
Medication and allergies:  NKDA.  Patient is to call back with list of medications.    90 day supply/mail order: n/a Local pharmacy:  [facility] in [place], [place] on [address]   Immunizations due:  Influenza-declined and Tdap upon appt.     A/P: Personal, surgical and family history updated.   PAP- 12/28/11-negative; per pt. Last pap was 11/2012. CCS- never had one before, plans to schedule one at a later date. MMG- 01/03/13- negative Flu-declined Tdap- it has been greater than 10 years    To Discuss with Provider: Not at this time.

## 2013-04-09 ENCOUNTER — Encounter: Payer: Self-pay | Admitting: Family Medicine

## 2013-04-09 ENCOUNTER — Encounter (INDEPENDENT_AMBULATORY_CARE_PROVIDER_SITE_OTHER): Payer: Self-pay

## 2013-04-09 ENCOUNTER — Ambulatory Visit (INDEPENDENT_AMBULATORY_CARE_PROVIDER_SITE_OTHER): Payer: BC Managed Care – PPO | Admitting: Family Medicine

## 2013-04-09 VITALS — BP 137/85 | HR 60 | Temp 98.2°F | Ht 65.5 in | Wt 171.2 lb

## 2013-04-09 DIAGNOSIS — R609 Edema, unspecified: Secondary | ICD-10-CM

## 2013-04-09 DIAGNOSIS — Z23 Encounter for immunization: Secondary | ICD-10-CM

## 2013-04-09 DIAGNOSIS — Z0000 Encounter for general adult medical examination without abnormal findings: Secondary | ICD-10-CM

## 2013-04-09 DIAGNOSIS — F411 Generalized anxiety disorder: Secondary | ICD-10-CM

## 2013-04-09 LAB — CBC WITH DIFFERENTIAL/PLATELET
BASOS ABS: 0 10*3/uL (ref 0.0–0.1)
Basophils Relative: 0.4 % (ref 0.0–3.0)
Eosinophils Absolute: 0.2 10*3/uL (ref 0.0–0.7)
Eosinophils Relative: 3.6 % (ref 0.0–5.0)
HEMATOCRIT: 39.1 % (ref 36.0–46.0)
HEMOGLOBIN: 12.9 g/dL (ref 12.0–15.0)
LYMPHS ABS: 1.3 10*3/uL (ref 0.7–4.0)
Lymphocytes Relative: 31.4 % (ref 12.0–46.0)
MCHC: 33 g/dL (ref 30.0–36.0)
MCV: 93.6 fl (ref 78.0–100.0)
MONO ABS: 0.3 10*3/uL (ref 0.1–1.0)
MONOS PCT: 6.1 % (ref 3.0–12.0)
NEUTROS ABS: 2.5 10*3/uL (ref 1.4–7.7)
Neutrophils Relative %: 58.5 % (ref 43.0–77.0)
PLATELETS: 218 10*3/uL (ref 150.0–400.0)
RBC: 4.18 Mil/uL (ref 3.87–5.11)
RDW: 13.4 % (ref 11.5–14.6)
WBC: 4.2 10*3/uL — ABNORMAL LOW (ref 4.5–10.5)

## 2013-04-09 LAB — POCT URINALYSIS DIPSTICK
Bilirubin, UA: NEGATIVE
Blood, UA: NEGATIVE
GLUCOSE UA: NEGATIVE
Ketones, UA: NEGATIVE
LEUKOCYTES UA: NEGATIVE
Nitrite, UA: NEGATIVE
PROTEIN UA: NEGATIVE
Spec Grav, UA: <=1.005
UROBILINOGEN UA: 0.2
pH, UA: 7.5

## 2013-04-09 LAB — BASIC METABOLIC PANEL
BUN: 12 mg/dL (ref 6–23)
CHLORIDE: 102 meq/L (ref 96–112)
CO2: 27 meq/L (ref 19–32)
Calcium: 8.8 mg/dL (ref 8.4–10.5)
Creatinine, Ser: 0.9 mg/dL (ref 0.4–1.2)
GFR: 72.62 mL/min (ref >60.00)
GLUCOSE: 77 mg/dL (ref 70–99)
POTASSIUM: 3.4 meq/L — AB (ref 3.5–5.1)
SODIUM: 138 meq/L (ref 135–145)

## 2013-04-09 LAB — LIPID PANEL
CHOLESTEROL: 194 mg/dL (ref 0–200)
HDL: 61.8 mg/dL (ref >39.00)
LDL Cholesterol: 118 mg/dL — ABNORMAL HIGH (ref 0–99)
TRIGLYCERIDES: 71 mg/dL (ref 0.0–149.0)
Total CHOL/HDL Ratio: 3
VLDL: 14.2 mg/dL (ref 0.0–40.0)

## 2013-04-09 LAB — HEPATIC FUNCTION PANEL
ALBUMIN: 3.8 g/dL (ref 3.5–5.2)
ALT: 16 U/L (ref 0–35)
AST: 21 U/L (ref 0–37)
Alkaline Phosphatase: 68 U/L (ref 39–117)
Bilirubin, Direct: 0 mg/dL (ref 0.0–0.3)
Total Bilirubin: 0.6 mg/dL (ref 0.3–1.2)
Total Protein: 6.6 g/dL (ref 6.0–8.3)

## 2013-04-09 LAB — MICROALBUMIN / CREATININE URINE RATIO
CREATININE, U: 111.5 mg/dL
MICROALB/CREAT RATIO: 0.2 mg/g (ref 0.0–30.0)
Microalb, Ur: 0.2 mg/dL (ref 0.0–1.9)

## 2013-04-09 LAB — TSH: TSH: 1.74 u[IU]/mL (ref 0.35–5.50)

## 2013-04-09 MED ORDER — TRIAMTERENE-HCTZ 37.5-25 MG PO TABS: 1.0000 | ORAL_TABLET | Freq: Every day | ORAL | Status: DC

## 2013-04-09 MED ORDER — BUPROPION HCL ER (XL) 300 MG PO TB24: 300.0000 mg | ORAL_TABLET | Freq: Every day | ORAL | Status: DC

## 2013-04-09 MED ORDER — CITALOPRAM HYDROBROMIDE 20 MG PO TABS: 20.0000 mg | ORAL_TABLET | Freq: Every day | ORAL | Status: DC

## 2013-04-09 MED ORDER — BUPROPION HCL ER (XL) 150 MG PO TB24: 150.0000 mg | ORAL_TABLET | Freq: Every day | ORAL | Status: DC

## 2013-04-09 NOTE — Progress Notes (Signed)
Subjective:     [name] is a 53 y.o. female and is here for a comprehensive physical exam. The patient reports no problems.  History   Social History  . Marital Status: Divorced    Spouse Name: N/A    Number of Children: N/A  . Years of Education: N/A   Occupational History  . Not on file.   Social History Main Topics  . Smoking status: Never Smoker   . Smokeless tobacco: Never Used  . Alcohol Use: Yes  . Drug Use: No  . Sexual Activity: Not on file   Other Topics Concern  . Not on file   Social History Narrative  . No narrative on file   Health Maintenance  Topic Date Due  . Tetanus/tdap  02/06/1980  . Colonoscopy  02/06/2011  . Influenza Vaccine  12/04/2013  . Pap Smear  12/28/2014  . Mammogram  01/04/2015    The following portions of the patient's history were reviewed and updated as appropriate:  She  has a past medical history of Cervical cancer. She  does not have a problem list on file. She  has past surgical history that includes Laparoscopic radical total hysterectomy w/ node biopsy. Her family history includes Prostate cancer in her father. She  reports that she has never smoked. She has never used smokeless tobacco. She reports that she drinks alcohol. She reports that she does not use illicit drugs. She has a current medication list which includes the following prescription(s): citalopram, triamterene-hydrochlorothiazide, and bupropion. No current outpatient prescriptions on file prior to visit.   No current facility-administered medications on file prior to visit.   She is allergic to vicodin..  Review of Systems Review of Systems  Constitutional: Negative for activity change, appetite change and fatigue.  HENT: Negative for hearing loss, congestion, tinnitus and ear discharge.  dentist q54m Eyes: Negative for visual disturbance (see optho q1y -- vision corrected to 20/20 with glasses).  Respiratory: Negative for cough, chest tightness and  shortness of breath.   Cardiovascular: Negative for chest pain, palpitations and leg swelling.  Gastrointestinal: Negative for abdominal pain, diarrhea, constipation and abdominal distention.  Genitourinary: Negative for urgency, frequency, decreased urine volume and difficulty urinating.  Musculoskeletal: Negative for back pain, arthralgias and gait problem.  Skin: Negative for color change, pallor and rash.  Neurological: Negative for dizziness, light-headedness, numbness and headaches.  Hematological: Negative for adenopathy. Does not bruise/bleed easily.  Psychiatric/Behavioral: Negative for suicidal ideas, confusion, sleep disturbance, self-injury, dysphoric mood, decreased concentration and agitation.       Objective:    BP 137/85  Pulse 60  Temp(Src) 98.2 F (36.8 C) (Oral)  Ht 5' 5.5" (1.664 m)  Wt 171 lb 3.2 oz (77.656 kg)  BMI 28.05 kg/m2  SpO2 99% General appearance: alert, cooperative, appears stated age and no distress Head: Normocephalic, without obvious abnormality, atraumatic Eyes: conjunctivae/corneas clear. PERRL, EOM's intact. Fundi benign. Ears: normal TM's and external ear canals both ears Nose: Nares normal. Septum midline. Mucosa normal. No drainage or sinus tenderness. Throat: lips, mucosa, and tongue normal; teeth and gums normal Neck: no adenopathy, no carotid bruit, no JVD, supple, symmetrical, trachea midline and thyroid not enlarged, symmetric, no tenderness/mass/nodules Back: symmetric, no curvature. ROM normal. No CVA tenderness. Lungs: clear to auscultation bilaterally Breasts: gyn Heart: regular rate and rhythm, S1, S2 normal, no murmur, click, rub or gallop Abdomen: soft, non-tender; bowel sounds normal; no masses,  no organomegaly Pelvic: deferred--gyn Extremities: extremities normal, atraumatic, no cyanosis or  edema Pulses: 2+ and symmetric Skin: Skin color, texture, turgor normal. No rashes or lesions Lymph nodes: Cervical, supraclavicular,  and axillary nodes normal. Neurologic: Alert and oriented X 3, normal strength and tone. Normal symmetric reflexes. Normal coordination and gait   ekg--nsr Assessment:    Healthy female exam.       Plan:    ghm utd Check labs See After Visit Summary for Counseling Recommendations

## 2013-04-09 NOTE — Progress Notes (Signed)
Pre visit review using our clinic review tool, if applicable. No additional management support is needed unless otherwise documented below in the visit note. 

## 2013-04-09 NOTE — Patient Instructions (Signed)
Preventive Care for Adults, Female A healthy lifestyle and preventive care can promote health and wellness. Preventive health guidelines for women include the following key practices.  A routine yearly physical is a good way to check with your health care provider about your health and preventive screening. It is a chance to share any concerns and updates on your health and to receive a thorough exam.  Visit your dentist for a routine exam and preventive care every 6 months. Brush your teeth twice a day and floss once a day. Good oral hygiene prevents tooth decay and gum disease.  The frequency of eye exams is based on your age, health, family medical history, use of contact lenses, and other factors. Follow your health care provider's recommendations for frequency of eye exams.  Eat a healthy diet. Foods like vegetables, fruits, whole grains, low-fat dairy products, and lean protein foods contain the nutrients you need without too many calories. Decrease your intake of foods high in solid fats, added sugars, and salt. Eat the right amount of calories for you.Get information about a proper diet from your health care provider, if necessary.  Regular physical exercise is one of the most important things you can do for your health. Most adults should get at least 150 minutes of moderate-intensity exercise (any activity that increases your heart rate and causes you to sweat) each week. In addition, most adults need muscle-strengthening exercises on 2 or more days a week.  Maintain a healthy weight. The body mass index (BMI) is a screening tool to identify possible weight problems. It provides an estimate of body fat based on height and weight. Your health care provider can find your BMI, and can help you achieve or maintain a healthy weight.For adults 20 years and older:  A BMI below 18.5 is considered underweight.  A BMI of 18.5 to 24.9 is normal.  A BMI of 25 to 29.9 is considered overweight.  A  BMI of 30 and above is considered obese.  Maintain normal blood lipids and cholesterol levels by exercising and minimizing your intake of saturated fat. Eat a balanced diet with plenty of fruit and vegetables. Blood tests for lipids and cholesterol should begin at age 62 and be repeated every 5 years. If your lipid or cholesterol levels are high, you are over 50, or you are at high risk for heart disease, you may need your cholesterol levels checked more frequently.Ongoing high lipid and cholesterol levels should be treated with medicines if diet and exercise are not working.  If you smoke, find out from your health care provider how to quit. If you do not use tobacco, do not start.  Lung cancer screening is recommended for adults aged 36 80 years who are at high risk for developing lung cancer because of a history of smoking. A yearly low-dose CT scan of the lungs is recommended for people who have at least a 30-pack-year history of smoking and are a current smoker or have quit within the past 15 years. A pack year of smoking is smoking an average of 1 pack of cigarettes a day for 1 year (for example: 1 pack a day for 30 years or 2 packs a day for 15 years). Yearly screening should continue until the smoker has stopped smoking for at least 15 years. Yearly screening should be stopped for people who develop a health problem that would prevent them from having lung cancer treatment.  If you are pregnant, do not drink alcohol. If you  are breastfeeding, be very cautious about drinking alcohol. If you are not pregnant and choose to drink alcohol, do not have more than 1 drink per day. One drink is considered to be 12 ounces (355 mL) of beer, 5 ounces (148 mL) of wine, or 1.5 ounces (44 mL) of liquor.  Avoid use of street drugs. Do not share needles with anyone. Ask for help if you need support or instructions about stopping the use of drugs.  High blood pressure causes heart disease and increases the risk  of stroke. Your blood pressure should be checked at least every 1 to 2 years. Ongoing high blood pressure should be treated with medicines if weight loss and exercise do not work.  If you are 44 53 years old, ask your health care provider if you should take aspirin to prevent strokes.  Diabetes screening involves taking a blood sample to check your fasting blood sugar level. This should be done once every 3 years, after age 97, if you are within normal weight and without risk factors for diabetes. Testing should be considered at a younger age or be carried out more frequently if you are overweight and have at least 1 risk factor for diabetes.  Breast cancer screening is essential preventive care for women. You should practice "breast self-awareness." This means understanding the normal appearance and feel of your breasts and may include breast self-examination. Any changes detected, no matter how small, should be reported to a health care provider. Women in their 7s and 30s should have a clinical breast exam (CBE) by a health care provider as part of a regular health exam every 1 to 3 years. After age 24, women should have a CBE every year. Starting at age 36, women should consider having a mammogram (breast X-ray test) every year. Women who have a family history of breast cancer should talk to their health care provider about genetic screening. Women at a high risk of breast cancer should talk to their health care providers about having an MRI and a mammogram every year.  Breast cancer gene (BRCA)-related cancer risk assessment is recommended for women who have family members with BRCA-related cancers. BRCA-related cancers include breast, ovarian, tubal, and peritoneal cancers. Having family members with these cancers may be associated with an increased risk for harmful changes (mutations) in the breast cancer genes BRCA1 and BRCA2. Results of the assessment will determine the need for genetic counseling  and BRCA1 and BRCA2 testing.  The Pap test is a screening test for cervical cancer. A Pap test can show cell changes on the cervix that might become cervical cancer if left untreated. A Pap test is a procedure in which cells are obtained and examined from the lower end of the uterus (cervix).  Women should have a Pap test starting at age 60.  Between ages 11 and 25, Pap tests should be repeated every 2 years.  Beginning at age 55, you should have a Pap test every 3 years as long as the past 3 Pap tests have been normal.  Some women have medical problems that increase the chance of getting cervical cancer. Talk to your health care provider about these problems. It is especially important to talk to your health care provider if a new problem develops soon after your last Pap test. In these cases, your health care provider may recommend more frequent screening and Pap tests.  The above recommendations are the same for women who have or have not gotten the vaccine  for human papillomavirus (HPV).  If you had a hysterectomy for a problem that was not cancer or a condition that could lead to cancer, then you no longer need Pap tests. Even if you no longer need a Pap test, a regular exam is a good idea to make sure no other problems are starting.  If you are between ages 9 and 77 years, and you have had normal Pap tests going back 10 years, you no longer need Pap tests. Even if you no longer need a Pap test, a regular exam is a good idea to make sure no other problems are starting.  If you have had past treatment for cervical cancer or a condition that could lead to cancer, you need Pap tests and screening for cancer for at least 20 years after your treatment.  If Pap tests have been discontinued, risk factors (such as a new sexual partner) need to be reassessed to determine if screening should be resumed.  The HPV test is an additional test that may be used for cervical cancer screening. The HPV test  looks for the virus that can cause the cell changes on the cervix. The cells collected during the Pap test can be tested for HPV. The HPV test could be used to screen women aged 12 years and older, and should be used in women of any age who have unclear Pap test results. After the age of 31, women should have HPV testing at the same frequency as a Pap test.  Colorectal cancer can be detected and often prevented. Most routine colorectal cancer screening begins at the age of 51 years and continues through age 48 years. However, your health care provider may recommend screening at an earlier age if you have risk factors for colon cancer. On a yearly basis, your health care provider may provide home test kits to check for hidden blood in the stool. Use of a small camera at the end of a tube, to directly examine the colon (sigmoidoscopy or colonoscopy), can detect the earliest forms of colorectal cancer. Talk to your health care provider about this at age 51, when routine screening begins. Direct exam of the colon should be repeated every 5 10 years through age 64 years, unless early forms of pre-cancerous polyps or small growths are found.  People who are at an increased risk for hepatitis B should be screened for this virus. You are considered at high risk for hepatitis B if:  You were born in a country where hepatitis B occurs often. Talk with your health care provider about which countries are considered high risk.  Your parents were born in a high-risk country and you have not received a shot to protect against hepatitis B (hepatitis B vaccine).  You have HIV or AIDS.  You use needles to inject street drugs.  You live with, or have sex with, someone who has Hepatitis B.  You get hemodialysis treatment.  You take certain medicines for conditions like cancer, organ transplantation, and autoimmune conditions.  Hepatitis C blood testing is recommended for all people born from [birth year] through 1965 and  any individual with known risks for hepatitis C.  Practice safe sex. Use condoms and avoid high-risk sexual practices to reduce the spread of sexually transmitted infections (STIs). STIs include gonorrhea, chlamydia, syphilis, trichomonas, herpes, HPV, and human immunodeficiency virus (HIV). Herpes, HIV, and HPV are viral illnesses that have no cure. They can result in disability, cancer, and death. Sexually active women aged 19  years and younger should be checked for chlamydia. Older women with new or multiple partners should also be tested for chlamydia. Testing for other STIs is recommended if you are sexually active and at increased risk.  Osteoporosis is a disease in which the bones lose minerals and strength with aging. This can result in serious bone fractures or breaks. The risk of osteoporosis can be identified using a bone density scan. Women ages 49 years and over and women at risk for fractures or osteoporosis should discuss screening with their health care providers. Ask your health care provider whether you should take a calcium supplement or vitamin D to reduce the rate of osteoporosis.  Menopause can be associated with physical symptoms and risks. Hormone replacement therapy is available to decrease symptoms and risks. You should talk to your health care provider about whether hormone replacement therapy is right for you.  Use sunscreen. Apply sunscreen liberally and repeatedly throughout the day. You should seek shade when your shadow is shorter than you. Protect yourself by wearing long sleeves, pants, a wide-brimmed hat, and sunglasses year round, whenever you are outdoors.  Once a month, do a whole body skin exam, using a mirror to look at the skin on your back. Tell your health care provider of new moles, moles that have irregular borders, moles that are larger than a pencil eraser, or moles that have changed in shape or color.  Stay current with required vaccines  (immunizations).  Influenza vaccine. All adults should be immunized every year.  Tetanus, diphtheria, and acellular pertussis (Td, Tdap) vaccine. Pregnant women should receive 1 dose of Tdap vaccine during each pregnancy. The dose should be obtained regardless of the length of time since the last dose. Immunization is preferred during the 27th 36th week of gestation. An adult who has not previously received Tdap or who does not know her vaccine status should receive 1 dose of Tdap. This initial dose should be followed by tetanus and diphtheria toxoids (Td) booster doses every 10 years. Adults with an unknown or incomplete history of completing a 3-dose immunization series with Td-containing vaccines should begin or complete a primary immunization series including a Tdap dose. Adults should receive a Td booster every 10 years.  Varicella vaccine. An adult without evidence of immunity to varicella should receive 2 doses or a second dose if she has previously received 1 dose. Pregnant females who do not have evidence of immunity should receive the first dose after pregnancy. This first dose should be obtained before leaving the health care facility. The second dose should be obtained 4 8 weeks after the first dose.  Human papillomavirus (HPV) vaccine. Females aged 65 26 years who have not received the vaccine previously should obtain the 3-dose series. The vaccine is not recommended for use in pregnant females. However, pregnancy testing is not needed before receiving a dose. If a female is found to be pregnant after receiving a dose, no treatment is needed. In that case, the remaining doses should be delayed until after the pregnancy. Immunization is recommended for any person with an immunocompromised condition through the age of 70 years if she did not get any or all doses earlier. During the 3-dose series, the second dose should be obtained 4 8 weeks after the first dose. The third dose should be obtained  24 weeks after the first dose and 16 weeks after the second dose.  Zoster vaccine. One dose is recommended for adults aged 72 years or older unless certain  conditions are present.  Measles, mumps, and rubella (MMR) vaccine. Adults born before 60 generally are considered immune to measles and mumps. Adults born in [birth year] or later should have 1 or more doses of MMR vaccine unless there is a contraindication to the vaccine or there is laboratory evidence of immunity to each of the three diseases. A routine second dose of MMR vaccine should be obtained at least 28 days after the first dose for students attending postsecondary schools, health care workers, or international travelers. People who received inactivated measles vaccine or an unknown type of measles vaccine during 1963 1967 should receive 2 doses of MMR vaccine. People who received inactivated mumps vaccine or an unknown type of mumps vaccine before 1979 and are at high risk for mumps infection should consider immunization with 2 doses of MMR vaccine. For females of childbearing age, rubella immunity should be determined. If there is no evidence of immunity, females who are not pregnant should be vaccinated. If there is no evidence of immunity, females who are pregnant should delay immunization until after pregnancy. Unvaccinated health care workers born before [birth year] who lack laboratory evidence of measles, mumps, or rubella immunity or laboratory confirmation of disease should consider measles and mumps immunization with 2 doses of MMR vaccine or rubella immunization with 1 dose of MMR vaccine.  Pneumococcal 13-valent conjugate (PCV13) vaccine. When indicated, a person who is uncertain of her immunization history and has no record of immunization should receive the PCV13 vaccine. An adult aged 16 years or older who has certain medical conditions and has not been previously immunized should receive 1 dose of PCV13 vaccine. This PCV13 should be followed  with a dose of pneumococcal polysaccharide (PPSV23) vaccine. The PPSV23 vaccine dose should be obtained at least 8 weeks after the dose of PCV13 vaccine. An adult aged 78 years or older who has certain medical conditions and previously received 1 or more doses of PPSV23 vaccine should receive 1 dose of PCV13. The PCV13 vaccine dose should be obtained 1 or more years after the last PPSV23 vaccine dose.  Pneumococcal polysaccharide (PPSV23) vaccine. When PCV13 is also indicated, PCV13 should be obtained first. All adults aged 69 years and older should be immunized. An adult younger than age 76 years who has certain medical conditions should be immunized. Any person who resides in a nursing home or long-term care facility should be immunized. An adult smoker should be immunized. People with an immunocompromised condition and certain other conditions should receive both PCV13 and PPSV23 vaccines. People with human immunodeficiency virus (HIV) infection should be immunized as soon as possible after diagnosis. Immunization during chemotherapy or radiation therapy should be avoided. Routine use of PPSV23 vaccine is not recommended for American Indians, [place]atives, or people younger than 65 years unless there are medical conditions that require PPSV23 vaccine. When indicated, people who have unknown immunization and have no record of immunization should receive PPSV23 vaccine. One-time revaccination 5 years after the first dose of PPSV23 is recommended for people aged 38 64 years who have chronic kidney failure, nephrotic syndrome, asplenia, or immunocompromised conditions. People who received 1 2 doses of PPSV23 before age 9 years should receive another dose of PPSV23 vaccine at age 56 years or later if at least 5 years have passed since the previous dose. Doses of PPSV23 are not needed for people immunized with PPSV23 at or after age 33 years.  Meningococcal vaccine. Adults with asplenia or persistent complement  component deficiencies should receive 2  doses of quadrivalent meningococcal conjugate (MenACWY-D) vaccine. The doses should be obtained at least 2 months apart. Microbiologists working with certain meningococcal bacteria, [employer] recruits, people at risk during an outbreak, and people who travel to or live in countries with a high rate of meningitis should be immunized. A first-year college student up through age 4 years who is living in a residence hall should receive a dose if she did not receive a dose on or after her 16th birthday. Adults who have certain high-risk conditions should receive one or more doses of vaccine.  Hepatitis A vaccine. Adults who wish to be protected from this disease, have certain high-risk conditions, work with hepatitis A-infected animals, work in hepatitis A research labs, or travel to or work in countries with a high rate of hepatitis A should be immunized. Adults who were previously unvaccinated and who anticipate close contact with an international adoptee during the first 60 days after arrival in the [place] from a country with a high rate of hepatitis A should be immunized.  Hepatitis B vaccine. Adults who wish to be protected from this disease, have certain high-risk conditions, may be exposed to blood or other infectious body fluids, are household contacts or sex partners of hepatitis B positive people, are clients or workers in certain care facilities, or travel to or work in countries with a high rate of hepatitis B should be immunized.  Haemophilus influenzae type b (Hib) vaccine. A previously unvaccinated person with asplenia or sickle cell disease or having a scheduled splenectomy should receive 1 dose of Hib vaccine. Regardless of previous immunization, a recipient of a hematopoietic stem cell transplant should receive a 3-dose series 6 12 months after her successful transplant. Hib vaccine is not recommended for adults with HIV infection. Preventive  Services / Frequency Ages 32 to 39years  Blood pressure check.** / Every 1 to 2 years.  Lipid and cholesterol check.** / Every 5 years beginning at age 21.  Clinical breast exam.** / Every 3 years for women in their 58s and 84s.  BRCA-related cancer risk assessment.** / For women who have family members with a BRCA-related cancer (breast, ovarian, tubal, or peritoneal cancers).  Pap test.** / Every 2 years from ages 72 through 30. Every 3 years starting at age 22 through age 44 or 45 with a history of 3 consecutive normal Pap tests.  HPV screening.** / Every 3 years from ages 63 through ages 71 to 98 with a history of 3 consecutive normal Pap tests.  Hepatitis C blood test.** / For any individual with known risks for hepatitis C.  Skin self-exam. / Monthly.  Influenza vaccine. / Every year.  Tetanus, diphtheria, and acellular pertussis (Tdap, Td) vaccine.** / Consult your health care provider. Pregnant women should receive 1 dose of Tdap vaccine during each pregnancy. 1 dose of Td every 10 years.  Varicella vaccine.** / Consult your health care provider. Pregnant females who do not have evidence of immunity should receive the first dose after pregnancy.  HPV vaccine. / 3 doses over 6 months, if 65 and younger. The vaccine is not recommended for use in pregnant females. However, pregnancy testing is not needed before receiving a dose.  Measles, mumps, rubella (MMR) vaccine.** / You need at least 1 dose of MMR if you were born in 1957 or later. You may also need a 2nd dose. For females of childbearing age, rubella immunity should be determined. If there is no evidence of immunity, females who are not  pregnant should be vaccinated. If there is no evidence of immunity, females who are pregnant should delay immunization until after pregnancy.  Pneumococcal 13-valent conjugate (PCV13) vaccine.** / Consult your health care provider.  Pneumococcal polysaccharide (PPSV23) vaccine.** / 1 to 2  doses if you smoke cigarettes or if you have certain conditions.  Meningococcal vaccine.** / 1 dose if you are age 88 to 6 years and a Market researcher living in a residence hall, or have one of several medical conditions, you need to get vaccinated against meningococcal disease. You may also need additional booster doses.  Hepatitis A vaccine.** / Consult your health care provider.  Hepatitis B vaccine.** / Consult your health care provider.  Haemophilus influenzae type b (Hib) vaccine.** / Consult your health care provider. Ages 23 to 64years  Blood pressure check.** / Every 1 to 2 years.  Lipid and cholesterol check.** / Every 5 years beginning at age 20 years.  Lung cancer screening. / Every year if you are aged 51 80 years and have a 30-pack-year history of smoking and currently smoke or have quit within the past 15 years. Yearly screening is stopped once you have quit smoking for at least 15 years or develop a health problem that would prevent you from having lung cancer treatment.  Clinical breast exam.** / Every year after age 8 years.  BRCA-related cancer risk assessment.** / For women who have family members with a BRCA-related cancer (breast, ovarian, tubal, or peritoneal cancers).  Mammogram.** / Every year beginning at age 10 years and continuing for as long as you are in good health. Consult with your health care provider.  Pap test.** / Every 3 years starting at age 30 years through age 5 or 61 years with a history of 3 consecutive normal Pap tests.  HPV screening.** / Every 3 years from ages 39 years through ages 72 to 19 years with a history of 3 consecutive normal Pap tests.  Fecal occult blood test (FOBT) of stool. / Every year beginning at age 59 years and continuing until age 27 years. You may not need to do this test if you get a colonoscopy every 10 years.  Flexible sigmoidoscopy or colonoscopy.** / Every 5 years for a flexible sigmoidoscopy or every  10 years for a colonoscopy beginning at age 110 years and continuing until age 63 years.  Hepatitis C blood test.** / For all people born from [birth year] through 1965 and any individual with known risks for hepatitis C.  Skin self-exam. / Monthly.  Influenza vaccine. / Every year.  Tetanus, diphtheria, and acellular pertussis (Tdap/Td) vaccine.** / Consult your health care provider. Pregnant women should receive 1 dose of Tdap vaccine during each pregnancy. 1 dose of Td every 10 years.  Varicella vaccine.** / Consult your health care provider. Pregnant females who do not have evidence of immunity should receive the first dose after pregnancy.  Zoster vaccine.** / 1 dose for adults aged 46 years or older.  Measles, mumps, rubella (MMR) vaccine.** / You need at least 1 dose of MMR if you were born in 1957 or later. You may also need a 2nd dose. For females of childbearing age, rubella immunity should be determined. If there is no evidence of immunity, females who are not pregnant should be vaccinated. If there is no evidence of immunity, females who are pregnant should delay immunization until after pregnancy.  Pneumococcal 13-valent conjugate (PCV13) vaccine.** / Consult your health care provider.  Pneumococcal polysaccharide (PPSV23) vaccine.** / 1  to 2 doses if you smoke cigarettes or if you have certain conditions.  Meningococcal vaccine.** / Consult your health care provider.  Hepatitis A vaccine.** / Consult your health care provider.  Hepatitis B vaccine.** / Consult your health care provider.  Haemophilus influenzae type b (Hib) vaccine.** / Consult your health care provider. Ages 52 years and over  Blood pressure check.** / Every 1 to 2 years.  Lipid and cholesterol check.** / Every 5 years beginning at age 78 years.  Lung cancer screening. / Every year if you are aged 43 80 years and have a 30-pack-year history of smoking and currently smoke or have quit within the past 15 years.  Yearly screening is stopped once you have quit smoking for at least 15 years or develop a health problem that would prevent you from having lung cancer treatment.  Clinical breast exam.** / Every year after age 106 years.  BRCA-related cancer risk assessment.** / For women who have family members with a BRCA-related cancer (breast, ovarian, tubal, or peritoneal cancers).  Mammogram.** / Every year beginning at age 3 years and continuing for as long as you are in good health. Consult with your health care provider.  Pap test.** / Every 3 years starting at age 41 years through age 70 or 18 years with 3 consecutive normal Pap tests. Testing can be stopped between 65 and 70 years with 3 consecutive normal Pap tests and no abnormal Pap or HPV tests in the past 10 years.  HPV screening.** / Every 3 years from ages 75 years through ages 16 or 71 years with a history of 3 consecutive normal Pap tests. Testing can be stopped between 65 and 70 years with 3 consecutive normal Pap tests and no abnormal Pap or HPV tests in the past 10 years.  Fecal occult blood test (FOBT) of stool. / Every year beginning at age 68 years and continuing until age 60 years. You may not need to do this test if you get a colonoscopy every 10 years.  Flexible sigmoidoscopy or colonoscopy.** / Every 5 years for a flexible sigmoidoscopy or every 10 years for a colonoscopy beginning at age 51 years and continuing until age 75 years.  Hepatitis C blood test.** / For all people born from [birth year] through 1965 and any individual with known risks for hepatitis C.  Osteoporosis screening.** / A one-time screening for women ages 46 years and over and women at risk for fractures or osteoporosis.  Skin self-exam. / Monthly.  Influenza vaccine. / Every year.  Tetanus, diphtheria, and acellular pertussis (Tdap/Td) vaccine.** / 1 dose of Td every 10 years.  Varicella vaccine.** / Consult your health care provider.  Zoster vaccine.** / 1  dose for adults aged 33 years or older.  Pneumococcal 13-valent conjugate (PCV13) vaccine.** / Consult your health care provider.  Pneumococcal polysaccharide (PPSV23) vaccine.** / 1 dose for all adults aged 76 years and older.  Meningococcal vaccine.** / Consult your health care provider.  Hepatitis A vaccine.** / Consult your health care provider.  Hepatitis B vaccine.** / Consult your health care provider.  Haemophilus influenzae type b (Hib) vaccine.** / Consult your health care provider. ** Family history and personal history of risk and conditions may change your health care provider's recommendations. Document Released: 04/13/2001 Document Revised: 12/06/2012 Document Reviewed: 07/13/2010 [facility] Patient Information 2014 [place], [place].

## 2013-08-01 ENCOUNTER — Encounter: Payer: Self-pay | Admitting: Family Medicine

## 2014-04-10 ENCOUNTER — Telehealth: Payer: Self-pay | Admitting: *Deleted

## 2014-04-10 ENCOUNTER — Encounter: Payer: Self-pay | Admitting: *Deleted

## 2014-04-10 NOTE — Telephone Encounter (Signed)
Pre-Visit Call:   Reviewed allergies, medications, health history, and health maintenance with patient and made changes as appropriate.   Preferred pharmacy:  [facility] [address] [place]  Pap- 11/2013  patient reports she gets this yearly through [facility] [place]  CCS- 07/13/13- with Juanita Craver, DM at [facility]- diverticulosis only [facility]- 01/03/13 with Derry Lory, MD at [facility]: Neg  Flu- none, would like at visit Td- 04/09/13   Concerns: No questions or concerns at time of phone call.

## 2014-04-11 ENCOUNTER — Encounter: Payer: Self-pay | Admitting: Family Medicine

## 2014-04-11 ENCOUNTER — Ambulatory Visit (INDEPENDENT_AMBULATORY_CARE_PROVIDER_SITE_OTHER): Payer: 59 | Admitting: Family Medicine

## 2014-04-11 VITALS — BP 114/76 | HR 71 | Temp 98.0°F | Ht 66.0 in | Wt 168.8 lb

## 2014-04-11 DIAGNOSIS — F419 Anxiety disorder, unspecified: Secondary | ICD-10-CM | POA: Insufficient documentation

## 2014-04-11 DIAGNOSIS — R8290 Unspecified abnormal findings in urine: Secondary | ICD-10-CM

## 2014-04-11 DIAGNOSIS — R609 Edema, unspecified: Secondary | ICD-10-CM

## 2014-04-11 DIAGNOSIS — R600 Localized edema: Secondary | ICD-10-CM | POA: Insufficient documentation

## 2014-04-11 DIAGNOSIS — F411 Generalized anxiety disorder: Secondary | ICD-10-CM

## 2014-04-11 DIAGNOSIS — F329 Major depressive disorder, single episode, unspecified: Secondary | ICD-10-CM

## 2014-04-11 DIAGNOSIS — F32A Depression, unspecified: Secondary | ICD-10-CM

## 2014-04-11 DIAGNOSIS — Z0000 Encounter for general adult medical examination without abnormal findings: Secondary | ICD-10-CM

## 2014-04-11 LAB — POCT URINALYSIS DIPSTICK
BILIRUBIN UA: NEGATIVE
Blood, UA: NEGATIVE
GLUCOSE UA: NEGATIVE
Ketones, UA: NEGATIVE
NITRITE UA: NEGATIVE
Protein, UA: NEGATIVE
Spec Grav, UA: 1.025
Urobilinogen, UA: NEGATIVE
pH, UA: 8.5

## 2014-04-11 LAB — CBC WITH DIFFERENTIAL/PLATELET
Basophils Absolute: 0 10*3/uL (ref 0.0–0.1)
Basophils Relative: 0.7 % (ref 0.0–3.0)
Eosinophils Absolute: 0.1 10*3/uL (ref 0.0–0.7)
Eosinophils Relative: 2.8 % (ref 0.0–5.0)
HEMATOCRIT: 41.7 % (ref 36.0–46.0)
Hemoglobin: 14.2 g/dL (ref 12.0–15.0)
LYMPHS PCT: 31.9 % (ref 12.0–46.0)
Lymphs Abs: 1.1 10*3/uL (ref 0.7–4.0)
MCHC: 34.1 g/dL (ref 30.0–36.0)
MCV: 90.6 fl (ref 78.0–100.0)
MONOS PCT: 6.6 % (ref 3.0–12.0)
Monocytes Absolute: 0.2 10*3/uL (ref 0.1–1.0)
NEUTROS ABS: 2 10*3/uL (ref 1.4–7.7)
Neutrophils Relative %: 58 % (ref 43.0–77.0)
Platelets: 214 10*3/uL (ref 150.0–400.0)
RBC: 4.6 Mil/uL (ref 3.87–5.11)
RDW: 13.1 % (ref 11.5–15.5)
WBC: 3.5 10*3/uL — ABNORMAL LOW (ref 4.0–10.5)

## 2014-04-11 LAB — HEPATIC FUNCTION PANEL
ALBUMIN: 4.1 g/dL (ref 3.5–5.2)
ALT: 22 U/L (ref 0–35)
AST: 21 U/L (ref 0–37)
Alkaline Phosphatase: 90 U/L (ref 39–117)
Bilirubin, Direct: 0 mg/dL (ref 0.0–0.3)
TOTAL PROTEIN: 6.8 g/dL (ref 6.0–8.3)
Total Bilirubin: 0.5 mg/dL (ref 0.2–1.2)

## 2014-04-11 LAB — LIPID PANEL
CHOLESTEROL: 221 mg/dL — AB (ref 0–200)
HDL: 60.7 mg/dL (ref >39.00)
LDL Cholesterol: 143 mg/dL — ABNORMAL HIGH (ref 0–99)
NonHDL: 160.3
Total CHOL/HDL Ratio: 4
Triglycerides: 89 mg/dL (ref 0.0–149.0)
VLDL: 17.8 mg/dL (ref 0.0–40.0)

## 2014-04-11 LAB — BASIC METABOLIC PANEL
BUN: 11 mg/dL (ref 6–23)
CALCIUM: 9.7 mg/dL (ref 8.4–10.5)
CO2: 33 meq/L — AB (ref 19–32)
Chloride: 103 mEq/L (ref 96–112)
Creatinine, Ser: 0.94 mg/dL (ref 0.40–1.20)
GFR: 66.16 mL/min (ref >60.00)
GLUCOSE: 91 mg/dL (ref 70–99)
POTASSIUM: 4.3 meq/L (ref 3.5–5.1)
Sodium: 140 mEq/L (ref 135–145)

## 2014-04-11 LAB — TSH: TSH: 2.21 u[IU]/mL (ref 0.35–4.50)

## 2014-04-11 MED ORDER — CITALOPRAM HYDROBROMIDE 20 MG PO TABS: 20.0000 mg | ORAL_TABLET | Freq: Every day | ORAL | Status: DC

## 2014-04-11 MED ORDER — TRIAMTERENE-HCTZ 37.5-25 MG PO TABS: 1.0000 | ORAL_TABLET | Freq: Every day | ORAL | Status: DC

## 2014-04-11 MED ORDER — BUPROPION HCL ER (XL) 300 MG PO TB24: 300.0000 mg | ORAL_TABLET | Freq: Every day | ORAL | Status: DC

## 2014-04-11 MED ORDER — CITALOPRAM HYDROBROMIDE 40 MG PO TABS: 40.0000 mg | ORAL_TABLET | Freq: Every day | ORAL | Status: DC

## 2014-04-11 NOTE — Patient Instructions (Signed)
Preventive Care for Adults A healthy lifestyle and preventive care can promote health and wellness. Preventive health guidelines for women include the following key practices.  A routine yearly physical is a good way to check with your health care provider about your health and preventive screening. It is a chance to share any concerns and updates on your health and to receive a thorough exam.  Visit your dentist for a routine exam and preventive care every 6 months. Brush your teeth twice a day and floss once a day. Good oral hygiene prevents tooth decay and gum disease.  The frequency of eye exams is based on your age, health, family medical history, use of contact lenses, and other factors. Follow your health care provider's recommendations for frequency of eye exams.  Eat a healthy diet. Foods like vegetables, fruits, whole grains, low-fat dairy products, and lean protein foods contain the nutrients you need without too many calories. Decrease your intake of foods high in solid fats, added sugars, and salt. Eat the right amount of calories for you.Get information about a proper diet from your health care provider, if necessary.  Regular physical exercise is one of the most important things you can do for your health. Most adults should get at least 150 minutes of moderate-intensity exercise (any activity that increases your heart rate and causes you to sweat) each week. In addition, most adults need muscle-strengthening exercises on 2 or more days a week.  Maintain a healthy weight. The body mass index (BMI) is a screening tool to identify possible weight problems. It provides an estimate of body fat based on height and weight. Your health care provider can find your BMI and can help you achieve or maintain a healthy weight.For adults 20 years and older:  A BMI below 18.5 is considered underweight.  A BMI of 18.5 to 24.9 is normal.  A BMI of 25 to 29.9 is considered overweight.  A BMI of  30 and above is considered obese.  Maintain normal blood lipids and cholesterol levels by exercising and minimizing your intake of saturated fat. Eat a balanced diet with plenty of fruit and vegetables. Blood tests for lipids and cholesterol should begin at age 76 and be repeated every 5 years. If your lipid or cholesterol levels are high, you are over 50, or you are at high risk for heart disease, you may need your cholesterol levels checked more frequently.Ongoing high lipid and cholesterol levels should be treated with medicines if diet and exercise are not working.  If you smoke, find out from your health care provider how to quit. If you do not use tobacco, do not start.  Lung cancer screening is recommended for adults aged 22-80 years who are at high risk for developing lung cancer because of a history of smoking. A yearly low-dose CT scan of the lungs is recommended for people who have at least a 30-pack-year history of smoking and are a current smoker or have quit within the past 15 years. A pack year of smoking is smoking an average of 1 pack of cigarettes a day for 1 year (for example: 1 pack a day for 30 years or 2 packs a day for 15 years). Yearly screening should continue until the smoker has stopped smoking for at least 15 years. Yearly screening should be stopped for people who develop a health problem that would prevent them from having lung cancer treatment.  If you are pregnant, do not drink alcohol. If you are breastfeeding,  be very cautious about drinking alcohol. If you are not pregnant and choose to drink alcohol, do not have more than 1 drink per day. One drink is considered to be 12 ounces (355 mL) of beer, 5 ounces (148 mL) of wine, or 1.5 ounces (44 mL) of liquor.  Avoid use of street drugs. Do not share needles with anyone. Ask for help if you need support or instructions about stopping the use of drugs.  High blood pressure causes heart disease and increases the risk of  stroke. Your blood pressure should be checked at least every 1 to 2 years. Ongoing high blood pressure should be treated with medicines if weight loss and exercise do not work.  If you are 75-52 years old, ask your health care provider if you should take aspirin to prevent strokes.  Diabetes screening involves taking a blood sample to check your fasting blood sugar level. This should be done once every 3 years, after age 15, if you are within normal weight and without risk factors for diabetes. Testing should be considered at a younger age or be carried out more frequently if you are overweight and have at least 1 risk factor for diabetes.  Breast cancer screening is essential preventive care for women. You should practice "breast self-awareness." This means understanding the normal appearance and feel of your breasts and may include breast self-examination. Any changes detected, no matter how small, should be reported to a health care provider. Women in their 58s and 30s should have a clinical breast exam (CBE) by a health care provider as part of a regular health exam every 1 to 3 years. After age 16, women should have a CBE every year. Starting at age 53, women should consider having a mammogram (breast X-ray test) every year. Women who have a family history of breast cancer should talk to their health care provider about genetic screening. Women at a high risk of breast cancer should talk to their health care providers about having an MRI and a mammogram every year.  Breast cancer gene (BRCA)-related cancer risk assessment is recommended for women who have family members with BRCA-related cancers. BRCA-related cancers include breast, ovarian, tubal, and peritoneal cancers. Having family members with these cancers may be associated with an increased risk for harmful changes (mutations) in the breast cancer genes BRCA1 and BRCA2. Results of the assessment will determine the need for genetic counseling and  BRCA1 and BRCA2 testing.  Routine pelvic exams to screen for cancer are no longer recommended for nonpregnant women who are considered low risk for cancer of the pelvic organs (ovaries, uterus, and vagina) and who do not have symptoms. Ask your health care provider if a screening pelvic exam is right for you.  If you have had past treatment for cervical cancer or a condition that could lead to cancer, you need Pap tests and screening for cancer for at least 20 years after your treatment. If Pap tests have been discontinued, your risk factors (such as having a new sexual partner) need to be reassessed to determine if screening should be resumed. Some women have medical problems that increase the chance of getting cervical cancer. In these cases, your health care provider may recommend more frequent screening and Pap tests.  The HPV test is an additional test that may be used for cervical cancer screening. The HPV test looks for the virus that can cause the cell changes on the cervix. The cells collected during the Pap test can be  tested for HPV. The HPV test could be used to screen women aged 30 years and older, and should be used in women of any age who have unclear Pap test results. After the age of 30, women should have HPV testing at the same frequency as a Pap test.  Colorectal cancer can be detected and often prevented. Most routine colorectal cancer screening begins at the age of 50 years and continues through age 75 years. However, your health care provider may recommend screening at an earlier age if you have risk factors for colon cancer. On a yearly basis, your health care provider may provide home test kits to check for hidden blood in the stool. Use of a small camera at the end of a tube, to directly examine the colon (sigmoidoscopy or colonoscopy), can detect the earliest forms of colorectal cancer. Talk to your health care provider about this at age 50, when routine screening begins. Direct  exam of the colon should be repeated every 5-10 years through age 75 years, unless early forms of pre-cancerous polyps or small growths are found.  People who are at an increased risk for hepatitis B should be screened for this virus. You are considered at high risk for hepatitis B if:  You were born in a country where hepatitis B occurs often. Talk with your health care provider about which countries are considered high risk.  Your parents were born in a high-risk country and you have not received a shot to protect against hepatitis B (hepatitis B vaccine).  You have HIV or AIDS.  You use needles to inject street drugs.  You live with, or have sex with, someone who has hepatitis B.  You get hemodialysis treatment.  You take certain medicines for conditions like cancer, organ transplantation, and autoimmune conditions.  Hepatitis C blood testing is recommended for all people born from 1945 through 1965 and any individual with known risks for hepatitis C.  Practice safe sex. Use condoms and avoid high-risk sexual practices to reduce the spread of sexually transmitted infections (STIs). STIs include gonorrhea, chlamydia, syphilis, trichomonas, herpes, HPV, and human immunodeficiency virus (HIV). Herpes, HIV, and HPV are viral illnesses that have no cure. They can result in disability, cancer, and death.  You should be screened for sexually transmitted illnesses (STIs) including gonorrhea and chlamydia if:  You are sexually active and are younger than 24 years.  You are older than 24 years and your health care provider tells you that you are at risk for this type of infection.  Your sexual activity has changed since you were last screened and you are at an increased risk for chlamydia or gonorrhea. Ask your health care provider if you are at risk.  If you are at risk of being infected with HIV, it is recommended that you take a prescription medicine daily to prevent HIV infection. This is  called preexposure prophylaxis (PrEP). You are considered at risk if:  You are a heterosexual woman, are sexually active, and are at increased risk for HIV infection.  You take drugs by injection.  You are sexually active with a partner who has HIV.  Talk with your health care provider about whether you are at high risk of being infected with HIV. If you choose to begin PrEP, you should first be tested for HIV. You should then be tested every 3 months for as long as you are taking PrEP.  Osteoporosis is a disease in which the bones lose minerals and strength   with aging. This can result in serious bone fractures or breaks. The risk of osteoporosis can be identified using a bone density scan. Women ages 65 years and over and women at risk for fractures or osteoporosis should discuss screening with their health care providers. Ask your health care provider whether you should take a calcium supplement or vitamin D to reduce the rate of osteoporosis.  Menopause can be associated with physical symptoms and risks. Hormone replacement therapy is available to decrease symptoms and risks. You should talk to your health care provider about whether hormone replacement therapy is right for you.  Use sunscreen. Apply sunscreen liberally and repeatedly throughout the day. You should seek shade when your shadow is shorter than you. Protect yourself by wearing long sleeves, pants, a wide-brimmed hat, and sunglasses year round, whenever you are outdoors.  Once a month, do a whole body skin exam, using a mirror to look at the skin on your back. Tell your health care provider of new moles, moles that have irregular borders, moles that are larger than a pencil eraser, or moles that have changed in shape or color.  Stay current with required vaccines (immunizations).  Influenza vaccine. All adults should be immunized every year.  Tetanus, diphtheria, and acellular pertussis (Td, Tdap) vaccine. Pregnant women should  receive 1 dose of Tdap vaccine during each pregnancy. The dose should be obtained regardless of the length of time since the last dose. Immunization is preferred during the 27th-36th week of gestation. An adult who has not previously received Tdap or who does not know her vaccine status should receive 1 dose of Tdap. This initial dose should be followed by tetanus and diphtheria toxoids (Td) booster doses every 10 years. Adults with an unknown or incomplete history of completing a 3-dose immunization series with Td-containing vaccines should begin or complete a primary immunization series including a Tdap dose. Adults should receive a Td booster every 10 years.  Varicella vaccine. An adult without evidence of immunity to varicella should receive 2 doses or a second dose if she has previously received 1 dose. Pregnant females who do not have evidence of immunity should receive the first dose after pregnancy. This first dose should be obtained before leaving the health care facility. The second dose should be obtained 4-8 weeks after the first dose.  Human papillomavirus (HPV) vaccine. Females aged 13-26 years who have not received the vaccine previously should obtain the 3-dose series. The vaccine is not recommended for use in pregnant females. However, pregnancy testing is not needed before receiving a dose. If a female is found to be pregnant after receiving a dose, no treatment is needed. In that case, the remaining doses should be delayed until after the pregnancy. Immunization is recommended for any person with an immunocompromised condition through the age of 26 years if she did not get any or all doses earlier. During the 3-dose series, the second dose should be obtained 4-8 weeks after the first dose. The third dose should be obtained 24 weeks after the first dose and 16 weeks after the second dose.  Zoster vaccine. One dose is recommended for adults aged 60 years or older unless certain conditions are  present.  Measles, mumps, and rubella (MMR) vaccine. Adults born before 1957 generally are considered immune to measles and mumps. Adults born in 1957 or later should have 1 or more doses of MMR vaccine unless there is a contraindication to the vaccine or there is laboratory evidence of immunity to   each of the three diseases. A routine second dose of MMR vaccine should be obtained at least 28 days after the first dose for students attending postsecondary schools, health care workers, or international travelers. People who received inactivated measles vaccine or an unknown type of measles vaccine during 1963-1967 should receive 2 doses of MMR vaccine. People who received inactivated mumps vaccine or an unknown type of mumps vaccine before 1979 and are at high risk for mumps infection should consider immunization with 2 doses of MMR vaccine. For females of childbearing age, rubella immunity should be determined. If there is no evidence of immunity, females who are not pregnant should be vaccinated. If there is no evidence of immunity, females who are pregnant should delay immunization until after pregnancy. Unvaccinated health care workers born before 1957 who lack laboratory evidence of measles, mumps, or rubella immunity or laboratory confirmation of disease should consider measles and mumps immunization with 2 doses of MMR vaccine or rubella immunization with 1 dose of MMR vaccine.  Pneumococcal 13-valent conjugate (PCV13) vaccine. When indicated, a person who is uncertain of her immunization history and has no record of immunization should receive the PCV13 vaccine. An adult aged 19 years or older who has certain medical conditions and has not been previously immunized should receive 1 dose of PCV13 vaccine. This PCV13 should be followed with a dose of pneumococcal polysaccharide (PPSV23) vaccine. The PPSV23 vaccine dose should be obtained at least 8 weeks after the dose of PCV13 vaccine. An adult aged 19  years or older who has certain medical conditions and previously received 1 or more doses of PPSV23 vaccine should receive 1 dose of PCV13. The PCV13 vaccine dose should be obtained 1 or more years after the last PPSV23 vaccine dose.  Pneumococcal polysaccharide (PPSV23) vaccine. When PCV13 is also indicated, PCV13 should be obtained first. All adults aged 65 years and older should be immunized. An adult younger than age 65 years who has certain medical conditions should be immunized. Any person who resides in a nursing home or long-term care facility should be immunized. An adult smoker should be immunized. People with an immunocompromised condition and certain other conditions should receive both PCV13 and PPSV23 vaccines. People with human immunodeficiency virus (HIV) infection should be immunized as soon as possible after diagnosis. Immunization during chemotherapy or radiation therapy should be avoided. Routine use of PPSV23 vaccine is not recommended for American Indians, [place] Natives, or people younger than 65 years unless there are medical conditions that require PPSV23 vaccine. When indicated, people who have unknown immunization and have no record of immunization should receive PPSV23 vaccine. One-time revaccination 5 years after the first dose of PPSV23 is recommended for people aged 19-64 years who have chronic kidney failure, nephrotic syndrome, asplenia, or immunocompromised conditions. People who received 1-2 doses of PPSV23 before age 65 years should receive another dose of PPSV23 vaccine at age 65 years or later if at least 5 years have passed since the previous dose. Doses of PPSV23 are not needed for people immunized with PPSV23 at or after age 65 years.  Meningococcal vaccine. Adults with asplenia or persistent complement component deficiencies should receive 2 doses of quadrivalent meningococcal conjugate (MenACWY-D) vaccine. The doses should be obtained at least 2 months apart.  Microbiologists working with certain meningococcal bacteria, military recruits, people at risk during an outbreak, and people who travel to or live in countries with a high rate of meningitis should be immunized. A first-year college student up through age   21 years who is living in a residence hall should receive a dose if she did not receive a dose on or after her 16th birthday. Adults who have certain high-risk conditions should receive one or more doses of vaccine.  Hepatitis A vaccine. Adults who wish to be protected from this disease, have certain high-risk conditions, work with hepatitis A-infected animals, work in hepatitis A research labs, or travel to or work in countries with a high rate of hepatitis A should be immunized. Adults who were previously unvaccinated and who anticipate close contact with an international adoptee during the first 60 days after arrival in the [place] from a country with a high rate of hepatitis A should be immunized.  Hepatitis B vaccine. Adults who wish to be protected from this disease, have certain high-risk conditions, may be exposed to blood or other infectious body fluids, are household contacts or sex partners of hepatitis B positive people, are clients or workers in certain care facilities, or travel to or work in countries with a high rate of hepatitis B should be immunized.  Haemophilus influenzae type b (Hib) vaccine. A previously unvaccinated person with asplenia or sickle cell disease or having a scheduled splenectomy should receive 1 dose of Hib vaccine. Regardless of previous immunization, a recipient of a hematopoietic stem cell transplant should receive a 3-dose series 6-12 months after her successful transplant. Hib vaccine is not recommended for adults with HIV infection. Preventive Services / Frequency Ages 64 to 68 years  Blood pressure check.** / Every 1 to 2 years.  Lipid and cholesterol check.** / Every 5 years beginning at age  22.  Clinical breast exam.** / Every 3 years for women in their 88s and 53s.  BRCA-related cancer risk assessment.** / For women who have family members with a BRCA-related cancer (breast, ovarian, tubal, or peritoneal cancers).  Pap test.** / Every 2 years from ages 90 through 51. Every 3 years starting at age 21 through age 56 or 3 with a history of 3 consecutive normal Pap tests.  HPV screening.** / Every 3 years from ages 24 through ages 1 to 46 with a history of 3 consecutive normal Pap tests.  Hepatitis C blood test.** / For any individual with known risks for hepatitis C.  Skin self-exam. / Monthly.  Influenza vaccine. / Every year.  Tetanus, diphtheria, and acellular pertussis (Tdap, Td) vaccine.** / Consult your health care provider. Pregnant women should receive 1 dose of Tdap vaccine during each pregnancy. 1 dose of Td every 10 years.  Varicella vaccine.** / Consult your health care provider. Pregnant females who do not have evidence of immunity should receive the first dose after pregnancy.  HPV vaccine. / 3 doses over 6 months, if 72 and younger. The vaccine is not recommended for use in pregnant females. However, pregnancy testing is not needed before receiving a dose.  Measles, mumps, rubella (MMR) vaccine.** / You need at least 1 dose of MMR if you were born in 1957 or later. You may also need a 2nd dose. For females of childbearing age, rubella immunity should be determined. If there is no evidence of immunity, females who are not pregnant should be vaccinated. If there is no evidence of immunity, females who are pregnant should delay immunization until after pregnancy.  Pneumococcal 13-valent conjugate (PCV13) vaccine.** / Consult your health care provider.  Pneumococcal polysaccharide (PPSV23) vaccine.** / 1 to 2 doses if you smoke cigarettes or if you have certain conditions.  Meningococcal vaccine.** /  1 dose if you are age 19 to 21 years and a first-year college  student living in a residence hall, or have one of several medical conditions, you need to get vaccinated against meningococcal disease. You may also need additional booster doses.  Hepatitis A vaccine.** / Consult your health care provider.  Hepatitis B vaccine.** / Consult your health care provider.  Haemophilus influenzae type b (Hib) vaccine.** / Consult your health care provider. Ages 40 to 64 years  Blood pressure check.** / Every 1 to 2 years.  Lipid and cholesterol check.** / Every 5 years beginning at age 20 years.  Lung cancer screening. / Every year if you are aged 55-80 years and have a 30-pack-year history of smoking and currently smoke or have quit within the past 15 years. Yearly screening is stopped once you have quit smoking for at least 15 years or develop a health problem that would prevent you from having lung cancer treatment.  Clinical breast exam.** / Every year after age 40 years.  BRCA-related cancer risk assessment.** / For women who have family members with a BRCA-related cancer (breast, ovarian, tubal, or peritoneal cancers).  Mammogram.** / Every year beginning at age 40 years and continuing for as long as you are in good health. Consult with your health care provider.  Pap test.** / Every 3 years starting at age 30 years through age 65 or 70 years with a history of 3 consecutive normal Pap tests.  HPV screening.** / Every 3 years from ages 30 years through ages 65 to 70 years with a history of 3 consecutive normal Pap tests.  Fecal occult blood test (FOBT) of stool. / Every year beginning at age 50 years and continuing until age 75 years. You may not need to do this test if you get a colonoscopy every 10 years.  Flexible sigmoidoscopy or colonoscopy.** / Every 5 years for a flexible sigmoidoscopy or every 10 years for a colonoscopy beginning at age 50 years and continuing until age 75 years.  Hepatitis C blood test.** / For all people born from 1945 through  1965 and any individual with known risks for hepatitis C.  Skin self-exam. / Monthly.  Influenza vaccine. / Every year.  Tetanus, diphtheria, and acellular pertussis (Tdap/Td) vaccine.** / Consult your health care provider. Pregnant women should receive 1 dose of Tdap vaccine during each pregnancy. 1 dose of Td every 10 years.  Varicella vaccine.** / Consult your health care provider. Pregnant females who do not have evidence of immunity should receive the first dose after pregnancy.  Zoster vaccine.** / 1 dose for adults aged 60 years or older.  Measles, mumps, rubella (MMR) vaccine.** / You need at least 1 dose of MMR if you were born in 1957 or later. You may also need a 2nd dose. For females of childbearing age, rubella immunity should be determined. If there is no evidence of immunity, females who are not pregnant should be vaccinated. If there is no evidence of immunity, females who are pregnant should delay immunization until after pregnancy.  Pneumococcal 13-valent conjugate (PCV13) vaccine.** / Consult your health care provider.  Pneumococcal polysaccharide (PPSV23) vaccine.** / 1 to 2 doses if you smoke cigarettes or if you have certain conditions.  Meningococcal vaccine.** / Consult your health care provider.  Hepatitis A vaccine.** / Consult your health care provider.  Hepatitis B vaccine.** / Consult your health care provider.  Haemophilus influenzae type b (Hib) vaccine.** / Consult your health care provider. Ages 65   years and over  Blood pressure check.** / Every 1 to 2 years.  Lipid and cholesterol check.** / Every 5 years beginning at age 22 years.  Lung cancer screening. / Every year if you are aged 73-80 years and have a 30-pack-year history of smoking and currently smoke or have quit within the past 15 years. Yearly screening is stopped once you have quit smoking for at least 15 years or develop a health problem that would prevent you from having lung cancer  treatment.  Clinical breast exam.** / Every year after age 4 years.  BRCA-related cancer risk assessment.** / For women who have family members with a BRCA-related cancer (breast, ovarian, tubal, or peritoneal cancers).  Mammogram.** / Every year beginning at age 40 years and continuing for as long as you are in good health. Consult with your health care provider.  Pap test.** / Every 3 years starting at age 9 years through age 34 or 91 years with 3 consecutive normal Pap tests. Testing can be stopped between 65 and 70 years with 3 consecutive normal Pap tests and no abnormal Pap or HPV tests in the past 10 years.  HPV screening.** / Every 3 years from ages 57 years through ages 64 or 45 years with a history of 3 consecutive normal Pap tests. Testing can be stopped between 65 and 70 years with 3 consecutive normal Pap tests and no abnormal Pap or HPV tests in the past 10 years.  Fecal occult blood test (FOBT) of stool. / Every year beginning at age 15 years and continuing until age 17 years. You may not need to do this test if you get a colonoscopy every 10 years.  Flexible sigmoidoscopy or colonoscopy.** / Every 5 years for a flexible sigmoidoscopy or every 10 years for a colonoscopy beginning at age 86 years and continuing until age 71 years.  Hepatitis C blood test.** / For all people born from [birth year] through 1965 and any individual with known risks for hepatitis C.  Osteoporosis screening.** / A one-time screening for women ages 83 years and over and women at risk for fractures or osteoporosis.  Skin self-exam. / Monthly.  Influenza vaccine. / Every year.  Tetanus, diphtheria, and acellular pertussis (Tdap/Td) vaccine.** / 1 dose of Td every 10 years.  Varicella vaccine.** / Consult your health care provider.  Zoster vaccine.** / 1 dose for adults aged 61 years or older.  Pneumococcal 13-valent conjugate (PCV13) vaccine.** / Consult your health care provider.  Pneumococcal  polysaccharide (PPSV23) vaccine.** / 1 dose for all adults aged 28 years and older.  Meningococcal vaccine.** / Consult your health care provider.  Hepatitis A vaccine.** / Consult your health care provider.  Hepatitis B vaccine.** / Consult your health care provider.  Haemophilus influenzae type b (Hib) vaccine.** / Consult your health care provider. ** Family history and personal history of risk and conditions may change your health care provider's recommendations. Document Released: 04/13/2001 Document Revised: 07/02/2013 Document Reviewed: 07/13/2010 [facility] Patient Information 2015 [place], [place]. This information is not intended to replace advice given to you by your health care provider. Make sure you discuss any questions you have with your health care provider.

## 2014-04-11 NOTE — Progress Notes (Signed)
Pre visit review using our clinic review tool, if applicable. No additional management support is needed unless otherwise documented below in the visit note. 

## 2014-04-11 NOTE — Progress Notes (Signed)
Subjective:     [name] is a 54 y.o. female and is here for a comprehensive physical exam. The patient reports no problems.  History   Social History  . Marital Status: Divorced    Spouse Name: N/A  . Number of Children: N/A  . Years of Education: N/A   Occupational History  . paralegal     paralegal --- foreclosure   Social History Main Topics  . Smoking status: Never Smoker   . Smokeless tobacco: Never Used  . Alcohol Use: 0.0 oz/week    0 Standard drinks or equivalent per week  . Drug Use: No  . Sexual Activity:    Partners: Male   Other Topics Concern  . Not on file   Social History Narrative   Exercising-- walking    Health Maintenance  Topic Date Due  . INFLUENZA VACCINE  09/30/2014  . PAP SMEAR  12/28/2014  . MAMMOGRAM  01/04/2015  . TETANUS/TDAP  04/10/2023  . COLONOSCOPY  07/14/2023    The following portions of the patient's history were reviewed and updated as appropriate:  She  has a past medical history of Cervical cancer. She  does not have any pertinent problems on file. She  has past surgical history that includes Laparoscopic radical total hysterectomy w/ node biopsy. Her family history includes Prostate cancer in her father. She  reports that she has never smoked. She has never used smokeless tobacco. She reports that she drinks alcohol. She reports that she does not use illicit drugs. She has a current medication list which includes the following prescription(s): bupropion, cholecalciferol, citalopram, and triamterene-hydrochlorothiazide. Current Outpatient Prescriptions on File Prior to Visit  Medication Sig Dispense Refill  . cholecalciferol (VITAMIN D) 1000 UNITS tablet Take 2,000 Units by mouth daily.      No current facility-administered medications on file prior to visit.   She is allergic to vicodin..  Review of Systems Review of Systems  Constitutional: Negative for activity change, appetite change and fatigue.  HENT: Negative  for hearing loss, congestion, tinnitus and ear discharge.  dentist q24m Eyes: Negative for visual disturbance (see optho q1y -- vision corrected to 20/20 with glasses).  Respiratory: Negative for cough, chest tightness and shortness of breath.   Cardiovascular: Negative for chest pain, palpitations and leg swelling.  Gastrointestinal: Negative for abdominal pain, diarrhea, constipation and abdominal distention.  Genitourinary: Negative for urgency, frequency, decreased urine volume and difficulty urinating.  Musculoskeletal: Negative for back pain, arthralgias and gait problem.  Skin: Negative for color change, pallor and rash.  Neurological: Negative for dizziness, light-headedness, numbness and headaches.  Hematological: Negative for adenopathy. Does not bruise/bleed easily.  Psychiatric/Behavioral: Negative for suicidal ideas, confusion, sleep disturbance, self-injury, dysphoric mood, decreased concentration and agitation.       Objective:    BP 114/76 mmHg  Pulse 71  Temp(Src) 98 F (36.7 C) (Oral)  Ht 5\' 6"  (1.676 m)  Wt 168 lb 12.8 oz (76.567 kg)  BMI 27.26 kg/m2  SpO2 98% General appearance: alert, cooperative, appears stated age and no distress Head: Normocephalic, without obvious abnormality, atraumatic Eyes: conjunctivae/corneas clear. PERRL, EOM's intact. Fundi benign. Ears: normal TM's and external ear canals both ears Nose: Nares normal. Septum midline. Mucosa normal. No drainage or sinus tenderness. Throat: lips, mucosa, and tongue normal; teeth and gums normal Neck: no adenopathy, no carotid bruit, no JVD, supple, symmetrical, trachea midline and thyroid not enlarged, symmetric, no tenderness/mass/nodules Back: symmetric, no curvature. ROM normal. No CVA tenderness. Lungs: clear  to auscultation bilaterally Breasts: normal appearance, no masses or tenderness Heart: regular rate and rhythm, S1, S2 normal, no murmur, click, rub or gallop Abdomen: soft, non-tender;  bowel sounds normal; no masses,  no organomegaly Pelvic: deferred Extremities: extremities normal, atraumatic, no cyanosis or edema Pulses: 2+ and symmetric Skin: Skin color, texture, turgor normal. No rashes or lesions Lymph nodes: Cervical, supraclavicular, and axillary nodes normal. Neurologic: Alert and oriented X 3, normal strength and tone. Normal symmetric reflexes. Normal coordination and gait Psych--  No depression, no anxiety      Assessment:    Healthy female exam.       Plan:     ghm utd Check labs See After Visit Summary for Counseling Recommendations    1. Edema   - triamterene-hydrochlorothiazide (MAXZIDE-25) 37.5-25 MG per tablet; Take 1 tablet by mouth daily.  Dispense: 90 tablet; Refill: 3  2. Generalized anxiety disorder   - buPROPion (WELLBUTRIN XL) 300 MG 24 hr tablet; Take 1 tablet (300 mg total) by mouth daily.  Dispense: 90 tablet; Refill: 3  3. Depression   - citalopram (CELEXA) 40 MG tablet; Take 1 tablet (40 mg total) by mouth daily.  Dispense: 90 tablet; Refill: 3  4. Preventative health care   - Basic metabolic panel - CBC with Differential/Platelet - Hepatic function panel - Lipid panel - POCT urinalysis dipstick - TSH  5. Abnormal urine   - Urine Culture

## 2014-04-12 LAB — URINE CULTURE
COLONY COUNT: NO GROWTH
ORGANISM ID, BACTERIA: NO GROWTH

## 2014-04-23 ENCOUNTER — Encounter: Payer: Self-pay | Admitting: Family Medicine

## 2014-06-25 ENCOUNTER — Ambulatory Visit (INDEPENDENT_AMBULATORY_CARE_PROVIDER_SITE_OTHER): Payer: 59 | Admitting: Physician Assistant

## 2014-06-25 ENCOUNTER — Encounter: Payer: Self-pay | Admitting: Physician Assistant

## 2014-06-25 VITALS — BP 120/82 | HR 77 | Temp 98.0°F | Resp 16 | Ht 66.0 in | Wt 171.0 lb

## 2014-06-25 DIAGNOSIS — J029 Acute pharyngitis, unspecified: Secondary | ICD-10-CM

## 2014-06-25 LAB — POCT RAPID STREP A (OFFICE): Rapid Strep A Screen: NEGATIVE

## 2014-06-25 MED ORDER — FLUTICASONE PROPIONATE 50 MCG/ACT NA SUSP: 2.0000 | Freq: Every day | NASAL | Status: DC

## 2014-06-25 NOTE — Progress Notes (Signed)
Pre visit review using our clinic review tool, if applicable. No additional management support is needed unless otherwise documented below in the visit note. 

## 2014-06-25 NOTE — Patient Instructions (Signed)
We will once the result of your throat culture is in.  Your rapid strep test was negative and your exam seems consistent with a viral sore throat and some Eustachian tube dysfunction. Stay well hydrated.  Use the Flonase daily as directed. Use Saline nasal spray to flush out sinuses. Tylenol will help with sore throat.  Placing a humidifier in the bedroom and doing salt-water gargles will also be beneficial.  Symptoms should improve over the next 4-5 days.  Usually day 2-3 are the worst.

## 2014-06-25 NOTE — Progress Notes (Signed)
Patient presents to clinic today c/o sore throat and R ear pain x 1 days.  Denies cough, chest pain or shortness of breath.  Has not checked temperature.  Endorses rhinorrhea and PND. Denies recent travel or sick contact. Endorses history of seasonal allergies but is not taking anything for this. Did take OTC Sudafed last night without improvement.  Past Medical History  Diagnosis Date  . Cervical cancer     Current Outpatient Prescriptions on File Prior to Visit  Medication Sig Dispense Refill  . buPROPion (WELLBUTRIN XL) 300 MG 24 hr tablet Take 1 tablet (300 mg total) by mouth daily. 90 tablet 3  . cholecalciferol (VITAMIN D) 1000 UNITS tablet Take 2,000 Units by mouth daily.     . citalopram (CELEXA) 40 MG tablet Take 1 tablet (40 mg total) by mouth daily. 90 tablet 3  . triamterene-hydrochlorothiazide (MAXZIDE-25) 37.5-25 MG per tablet Take 1 tablet by mouth daily. 90 tablet 3   No current facility-administered medications on file prior to visit.    Allergies  Allergen Reactions  . Vicodin [Hydrocodone-Acetaminophen] Rash    Family History  Problem Relation Age of Onset  . Prostate cancer Father     History   Social History  . Marital Status: Divorced    Spouse Name: N/A  . Number of Children: N/A  . Years of Education: N/A   Occupational History  . paralegal     paralegal --- foreclosure   Social History Main Topics  . Smoking status: Never Smoker   . Smokeless tobacco: Never Used  . Alcohol Use: 0.0 oz/week    0 Standard drinks or equivalent per week  . Drug Use: No  . Sexual Activity:    Partners: Male   Other Topics Concern  . None   Social History Narrative   Exercising-- walking     Review of Systems - See HPI.  All other ROS are negative.  BP 120/82 mmHg  Pulse 77  Temp(Src) 98 F (36.7 C) (Oral)  Resp 16  Ht 5\' 6"  (1.676 m)  Wt 171 lb (77.565 kg)  BMI 27.61 kg/m2  SpO2 97%  Physical Exam  Constitutional: She is oriented to person,  place, and time and well-developed, well-nourished, and in no distress.  HENT:  Head: Normocephalic and atraumatic.  Right Ear: External ear and ear canal normal.  Left Ear: External ear and ear canal normal. A middle ear effusion is present.  Nose: Rhinorrhea present. Right sinus exhibits no maxillary sinus tenderness and no frontal sinus tenderness. Left sinus exhibits no maxillary sinus tenderness and no frontal sinus tenderness.  Mouth/Throat: Uvula is midline, oropharynx is clear and moist and mucous membranes are normal. No oropharyngeal exudate.  Eyes: Conjunctivae are normal.  Neck: Neck supple.  Cardiovascular: Normal rate, regular rhythm, normal heart sounds and intact distal pulses.   Pulmonary/Chest: Effort normal and breath sounds normal. No respiratory distress. She has no wheezes. She has no rales. She exhibits no tenderness.  Neurological: She is alert and oriented to person, place, and time.  Skin: Skin is warm and dry. No rash noted.  Psychiatric: Affect normal.  Vitals reviewed.   Recent Results (from the past 2160 hour(s))  Basic metabolic panel     Status: Abnormal   Collection Time: 04/11/14  9:37 AM  Result Value Ref Range   Sodium 140 135 - 145 mEq/L   Potassium 4.3 3.5 - 5.1 mEq/L   Chloride 103 96 - 112 mEq/L   CO2  33 (H) 19 - 32 mEq/L   Glucose, Bld 91 70 - 99 mg/dL   BUN 11 6 - 23 mg/dL   Creatinine, Ser 0.94 0.40 - 1.20 mg/dL   Calcium 9.7 8.4 - 10.5 mg/dL   GFR 66.16 >60.00 mL/min  CBC with Differential/Platelet     Status: Abnormal   Collection Time: 04/11/14  9:37 AM  Result Value Ref Range   WBC 3.5 (L) 4.0 - 10.5 K/uL   RBC 4.60 3.87 - 5.11 Mil/uL   Hemoglobin 14.2 12.0 - 15.0 g/dL   HCT 41.7 36.0 - 46.0 %   MCV 90.6 78.0 - 100.0 fl   MCHC 34.1 30.0 - 36.0 g/dL   RDW 13.1 11.5 - 15.5 %   Platelets 214.0 150.0 - 400.0 K/uL   Neutrophils Relative % 58.0 43.0 - 77.0 %   Lymphocytes Relative 31.9 12.0 - 46.0 %   Monocytes Relative 6.6 3.0 -  12.0 %   Eosinophils Relative 2.8 0.0 - 5.0 %   Basophils Relative 0.7 0.0 - 3.0 %   Neutro Abs 2.0 1.4 - 7.7 K/uL   Lymphs Abs 1.1 0.7 - 4.0 K/uL   Monocytes Absolute 0.2 0.1 - 1.0 K/uL   Eosinophils Absolute 0.1 0.0 - 0.7 K/uL   Basophils Absolute 0.0 0.0 - 0.1 K/uL  Hepatic function panel     Status: None   Collection Time: 04/11/14  9:37 AM  Result Value Ref Range   Total Bilirubin 0.5 0.2 - 1.2 mg/dL   Bilirubin, Direct 0.0 0.0 - 0.3 mg/dL   Alkaline Phosphatase 90 39 - 117 U/L   AST 21 0 - 37 U/L   ALT 22 0 - 35 U/L   Total Protein 6.8 6.0 - 8.3 g/dL   Albumin 4.1 3.5 - 5.2 g/dL  Lipid panel     Status: Abnormal   Collection Time: 04/11/14  9:37 AM  Result Value Ref Range   Cholesterol 221 (H) 0 - 200 mg/dL    Comment: ATP III Classification       Desirable:  < 200 mg/dL               Borderline High:  200 - 239 mg/dL          High:  > = 240 mg/dL   Triglycerides 89.0 0.0 - 149.0 mg/dL    Comment: Normal:  <150 mg/dLBorderline High:  150 - 199 mg/dL   HDL 60.70 >39.00 mg/dL   VLDL 17.8 0.0 - 40.0 mg/dL   LDL Cholesterol 143 (H) 0 - 99 mg/dL   Total CHOL/HDL Ratio 4     Comment:                Men          Women1/2 Average Risk     3.4          3.3Average Risk          5.0          4.42X Average Risk          9.6          7.13X Average Risk          15.0          11.0                       NonHDL 160.30     Comment: NOTE:  Non-HDL goal should be 30 mg/dL higher than patient's LDL  goal (i.e. LDL goal of < 70 mg/dL, would have non-HDL goal of < 100 mg/dL)  TSH     Status: None   Collection Time: 04/11/14  9:37 AM  Result Value Ref Range   TSH 2.21 0.35 - 4.50 uIU/mL  POCT urinalysis dipstick     Status: None   Collection Time: 04/11/14 11:09 AM  Result Value Ref Range   Color, UA yellow    Clarity, UA clear    Glucose, UA neg    Bilirubin, UA neg    Ketones, UA neg    Spec Grav, UA 1.025    Blood, UA neg    pH, UA 8.5    Protein, UA neg    Urobilinogen, UA negative     Nitrite, UA neg    Leukocytes, UA moderate (2+)   Urine Culture     Status: None   Collection Time: 04/11/14 11:15 AM  Result Value Ref Range   Colony Count NO GROWTH    Organism ID, Bacteria NO GROWTH   POCT rapid strep A     Status: Normal   Collection Time: 06/25/14  3:55 PM  Result Value Ref Range   Rapid Strep A Screen Negative Negative    Assessment/Plan: Sore throat With some Eustachian tube dysfunction noted. Rapid strep negative.  Will send for culture.  Exam looks good except for fluid behind TM. Rx Flonase. Saline nasal spray as directed. Increase fluids.  Rest.  Tylenol, salt water and humidifier to help with throat.  She is to call or return to clinic if symptoms are not improving.

## 2014-06-25 NOTE — Assessment & Plan Note (Signed)
With some Eustachian tube dysfunction noted. Rapid strep negative.  Will send for culture.  Exam looks good except for fluid behind TM. Rx Flonase. Saline nasal spray as directed. Increase fluids.  Rest.  Tylenol, salt water and humidifier to help with throat.  She is to call or return to clinic if symptoms are not improving.

## 2014-06-27 LAB — CULTURE, GROUP A STREP: Organism ID, Bacteria: NORMAL

## 2014-08-19 ENCOUNTER — Encounter: Payer: Self-pay | Admitting: Medical

## 2014-08-19 ENCOUNTER — Ambulatory Visit (INDEPENDENT_AMBULATORY_CARE_PROVIDER_SITE_OTHER): Payer: 59 | Admitting: Medical

## 2014-08-19 VITALS — BP 120/78 | HR 71 | Temp 98.5°F | Ht 66.0 in | Wt 171.4 lb

## 2014-08-19 DIAGNOSIS — M5441 Lumbago with sciatica, right side: Secondary | ICD-10-CM | POA: Insufficient documentation

## 2014-08-19 DIAGNOSIS — M5442 Lumbago with sciatica, left side: Secondary | ICD-10-CM

## 2014-08-19 DIAGNOSIS — M549 Dorsalgia, unspecified: Secondary | ICD-10-CM | POA: Insufficient documentation

## 2014-08-19 MED ORDER — KETOROLAC TROMETHAMINE 60 MG/2ML IM SOLN
60.0000 mg | Freq: Once | INTRAMUSCULAR | Status: AC
  Administered 2014-08-19: 60 mg via INTRAMUSCULAR

## 2014-08-19 NOTE — Patient Instructions (Signed)
Back pain Toradol 60 mg im in office to provide some relief until she can see her orthopedist tomorrow. Referral to orthopedist placed.    Follow up with [place] as needed post orthopedist appointment.

## 2014-08-19 NOTE — Progress Notes (Signed)
Pre visit review using our clinic review tool, if applicable. No additional management support is needed unless otherwise documented below in the visit note. 

## 2014-08-19 NOTE — Progress Notes (Signed)
Subjective:    Patient ID: [name], female    DOB: May 26, 1960, 54 y.o.   MRN: [id number]  HPI  Pt has some back pain. 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 Pt has history of some back pain on rt side in the past. 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 Flares rarely. Last flare back pain was 2-3 yrs ago. Eventually pt was told by specialist she may need surgery.  Pt can't see orthopedist without a referral. Pt will see Dr. Durward Fortes tomorrow. But needs a referral.  Pt states pain shooting down her left leg. Pt pain level presently mild but when she walks pain is severe. Pain level at times makes her nauseas.  Pt took 3 tylenol at 7. Did not help much/  No saddle anesthesia. No numbness. No foot drop.    Review of Systems  Constitutional: Negative for fever, chills and fatigue.  Respiratory: Negative for cough, chest tightness, shortness of breath and wheezing.   Cardiovascular: Negative for chest pain and palpitations.  Musculoskeletal: Positive for back pain.  Neurological: Negative for dizziness, seizures, syncope, speech difficulty, weakness, light-headedness, numbness and headaches.  Hematological: Negative for adenopathy. Does not bruise/bleed easily.  Psychiatric/Behavioral: Negative for behavioral problems and confusion.     Past Medical History  Diagnosis Date  . Cervical cancer     History   Social History  . Marital Status: Divorced    Spouse Name: N/A  . Number of Children: N/A  . Years of Education: N/A   Occupational History  . paralegal     paralegal --- foreclosure   Social History Main Topics  . Smoking status: Never Smoker   . Smokeless tobacco: Never Used  . Alcohol Use: 0.0 oz/week    0 Standard drinks or equivalent per week  . Drug Use: No  . Sexual Activity:    Partners: Male   Other Topics Concern  . Not on file   Social History Narrative   Exercising-- walking      Past Surgical History  Procedure Laterality Date  . Laparoscopic radical total hysterectomy w/ node biopsy      Family History  Problem Relation Age of Onset  . Prostate cancer Father     Allergies  Allergen Reactions  . Vicodin [Hydrocodone-Acetaminophen] Rash    Current Outpatient Prescriptions on File Prior to Visit  Medication Sig Dispense Refill  . buPROPion (WELLBUTRIN XL) 300 MG 24 hr tablet Take 1 tablet (300 mg total) by mouth daily. 90 tablet 3  . cholecalciferol (VITAMIN D) 1000 UNITS tablet Take 2,000 Units by mouth daily.     . citalopram (CELEXA) 40 MG tablet Take 1 tablet (40 mg total) by mouth daily. 90 tablet 3  . fluticasone (FLONASE) 50 MCG/ACT nasal spray Place 2 sprays into both nostrils daily. 16 g 6  . triamterene-hydrochlorothiazide (MAXZIDE-25) 37.5-25 MG per tablet Take 1 tablet by mouth daily. 90 tablet 3   No current facility-administered medications on file prior to visit.    BP 120/78 mmHg  Pulse 71  Temp(Src) 98.5 F (36.9 C) (Oral)  Ht 5\' 6"  (1.676 m)  Wt 171 lb 6.4 oz (77.747 kg)  BMI 27.68 kg/m2  SpO2 97%       Objective:   Physical Exam  General- No acute distress. Pleasant patient. Neck- Full range of motion, no jvd Lungs- Clear, even and unlabored. Heart- regular rate and rhythm. Neurologic- CNII- XII  grossly intact.  Back Mid lumbar spine tenderness to palpation moderate but mostly lt SI tenderness. Pain on straight leg lift. Pain on lateral movements and flexion/extension of the spine.  Lower ext neurologic  L5-S1 sensation intact bilaterally.  Normal patellar reflexes bilaterally. No foot drop bilaterally.      Assessment & Plan:

## 2014-08-19 NOTE — Assessment & Plan Note (Signed)
Toradol 60 mg im in office to provide some relief until she can see her orthopedist tomorrow. Referral to orthopedist placed.

## 2014-10-28 ENCOUNTER — Telehealth: Payer: Self-pay | Admitting: Family Medicine

## 2014-10-28 NOTE — Telephone Encounter (Signed)
She needs to be seen.      KP 

## 2014-10-28 NOTE — Telephone Encounter (Signed)
Pt called in because she says that she has another UTI and need a prescription for it. Pt would like to know if Dr. Etter Sjogren could just call it in or will she need to make an appt.?   CB#:  [phone]

## 2014-10-28 NOTE — Telephone Encounter (Signed)
Scheduled appt.

## 2014-10-29 ENCOUNTER — Ambulatory Visit (INDEPENDENT_AMBULATORY_CARE_PROVIDER_SITE_OTHER): Payer: 59 | Admitting: Family Medicine

## 2014-10-29 ENCOUNTER — Encounter: Payer: Self-pay | Admitting: Family Medicine

## 2014-10-29 VITALS — BP 128/80 | HR 62 | Temp 98.3°F | Wt 167.6 lb

## 2014-10-29 DIAGNOSIS — R300 Dysuria: Secondary | ICD-10-CM

## 2014-10-29 DIAGNOSIS — N390 Urinary tract infection, site not specified: Secondary | ICD-10-CM | POA: Diagnosis not present

## 2014-10-29 LAB — POCT URINALYSIS DIPSTICK
Bilirubin, UA: NEGATIVE
Blood, UA: NEGATIVE
GLUCOSE UA: NEGATIVE
KETONES UA: NEGATIVE
Nitrite, UA: POSITIVE
Protein, UA: NEGATIVE
SPEC GRAV UA: 1.015
Urobilinogen, UA: 1
pH, UA: 7

## 2014-10-29 MED ORDER — CIPROFLOXACIN HCL 500 MG PO TABS: 500.0000 mg | ORAL_TABLET | Freq: Two times a day (BID) | ORAL | Status: DC

## 2014-10-29 MED ORDER — PHENAZOPYRIDINE HCL 200 MG PO TABS: 200.0000 mg | ORAL_TABLET | Freq: Three times a day (TID) | ORAL | Status: DC | PRN

## 2014-10-29 NOTE — Patient Instructions (Signed)

## 2014-10-29 NOTE — Progress Notes (Signed)
Patient ID: [name], female    DOB: 12-18-1960  Age: 54 y.o. MRN: [id number]    Subjective:  Subjective HPI [name] presents for dysuria and frequency x 1 day.  ;no fever, chills.  No hematuria.  Review of Systems  Constitutional: Negative for fever, chills, activity change and appetite change.  Gastrointestinal: Negative for abdominal pain and abdominal distention.  Genitourinary: Positive for dysuria, urgency and frequency. Negative for hematuria, flank pain, vaginal discharge, difficulty urinating, genital sores, vaginal pain, menstrual problem, pelvic pain and dyspareunia.  Musculoskeletal: Negative for back pain.    History Past Medical History  Diagnosis Date  . Cervical cancer     She has past surgical history that includes Laparoscopic radical total hysterectomy w/ node biopsy.   Her family history includes Prostate cancer in her father.She reports that she has never smoked. She has never used smokeless tobacco. She reports that she drinks alcohol. She reports that she does not use illicit drugs.  Current Outpatient Prescriptions on File Prior to Visit  Medication Sig Dispense Refill  . buPROPion (WELLBUTRIN XL) 300 MG 24 hr tablet Take 1 tablet (300 mg total) by mouth daily. 90 tablet 3  . cholecalciferol (VITAMIN D) 1000 UNITS tablet Take 2,000 Units by mouth daily.     . citalopram (CELEXA) 40 MG tablet Take 1 tablet (40 mg total) by mouth daily. 90 tablet 3  . fluticasone (FLONASE) 50 MCG/ACT nasal spray Place 2 sprays into both nostrils daily. 16 g 6  . triamterene-hydrochlorothiazide (MAXZIDE-25) 37.5-25 MG per tablet Take 1 tablet by mouth daily. 90 tablet 3   No current facility-administered medications on file prior to visit.     Objective:  Objective Physical Exam  Constitutional: She appears well-developed and well-nourished. No distress.  Abdominal: Soft. She exhibits no distension. There is no tenderness (no suprapubic or CVA tenderness).    Psychiatric: She has a normal mood and affect. Her behavior is normal.  Vitals reviewed.  BP 128/80 mmHg  Pulse 62  Temp(Src) 98.3 F (36.8 C) (Oral)  Wt 167 lb 9.6 oz (76.023 kg)  SpO2 97% Wt Readings from Last 3 Encounters:  10/29/14 167 lb 9.6 oz (76.023 kg)  08/19/14 171 lb 6.4 oz (77.747 kg)  06/25/14 171 lb (77.565 kg)     Lab Results  Component Value Date   WBC 3.5* 04/11/2014   HGB 14.2 04/11/2014   HCT 41.7 04/11/2014   PLT 214.0 04/11/2014   GLUCOSE 91 04/11/2014   CHOL 221* 04/11/2014   TRIG 89.0 04/11/2014   HDL 60.70 04/11/2014   LDLCALC 143* 04/11/2014   ALT 22 04/11/2014   AST 21 04/11/2014   NA 140 04/11/2014   K 4.3 04/11/2014   CL 103 04/11/2014   CREATININE 0.94 04/11/2014   BUN 11 04/11/2014   CO2 33* 04/11/2014   TSH 2.21 04/11/2014   MICROALBUR 0.2 04/09/2013    Mm Digital Screening 3d Tomo  01/05/2013   CLINICAL DATA:  Screening.  EXAM: DIGITAL SCREENING BILATERAL MAMMOGRAM WITH 3D TOMO WITH CAD  DIGITAL BREAST TOMOSYNTHESIS  Digital breast tomosynthesis images are acquired in two projections. These images are reviewed in combination with the digital mammogram, confirming the findings below.  COMPARISON:  Previous exam(s).  ACR Breast Density Category c: The breasts are heterogeneously dense, which may obscure small masses.  FINDINGS: There are no findings suspicious for malignancy. Images were processed with CAD.  IMPRESSION: No mammographic evidence of malignancy. A result letter of this screening mammogram will  be mailed directly to the patient.  RECOMMENDATION: Screening mammogram in one year. (Code:SM-B-01Y)  BI-RADS CATEGORY  1: Negative   Electronically Signed   By: Everlean Alstrom M.D.   On: 01/05/2013 12:44     Assessment & Plan:  Plan I am having Ms. Heiser start on ciprofloxacin and phenazopyridine. I am also having her maintain her cholecalciferol, triamterene-hydrochlorothiazide, buPROPion, citalopram, fluticasone, BIOTIN PO, Fish  Oil, and phenazopyridine.  Meds ordered this encounter  Medications  . BIOTIN PO    Sig: Take by mouth.  . Fish Oil OIL    Sig: by Does not apply route.  . phenazopyridine (PYRIDIUM) 95 MG tablet    Sig: Take 95 mg by mouth 3 (three) times daily as needed for pain.  . ciprofloxacin (CIPRO) 500 MG tablet    Sig: Take 1 tablet (500 mg total) by mouth 2 (two) times daily.    Dispense:  10 tablet    Refill:  0  . phenazopyridine (PYRIDIUM) 200 MG tablet    Sig: Take 1 tablet (200 mg total) by mouth 3 (three) times daily as needed for pain.    Dispense:  10 tablet    Refill:  0    Problem List Items Addressed This Visit    None    Visit Diagnoses    Urinary tract infection without hematuria, site unspecified    -  Primary    Relevant Medications    phenazopyridine (PYRIDIUM) 95 MG tablet    ciprofloxacin (CIPRO) 500 MG tablet    phenazopyridine (PYRIDIUM) 200 MG tablet    Dysuria        Relevant Medications    ciprofloxacin (CIPRO) 500 MG tablet    phenazopyridine (PYRIDIUM) 200 MG tablet    Other Relevant Orders    POCT Urinalysis Dipstick    Urine Culture       Follow-up: Return if symptoms worsen or fail to improve.  Garnet Koyanagi, DO

## 2014-10-29 NOTE — Progress Notes (Signed)
Pre visit review using our clinic review tool, if applicable. No additional management support is needed unless otherwise documented below in the visit note. 

## 2014-11-01 LAB — URINE CULTURE

## 2014-11-24 ENCOUNTER — Emergency Department
Admission: EM | Admit: 2014-11-24 | Discharge: 2014-11-24 | Disposition: A | Discharge status: Other Acute Inpatient Hospital | Payer: 59 | Attending: Student | Admitting: Student

## 2014-11-24 ENCOUNTER — Encounter: Payer: Self-pay | Admitting: Emergency Medicine

## 2014-11-24 DIAGNOSIS — Z792 Long term (current) use of antibiotics: Secondary | ICD-10-CM | POA: Diagnosis not present

## 2014-11-24 DIAGNOSIS — X19XXXA Contact with other heat and hot substances, initial encounter: Secondary | ICD-10-CM | POA: Insufficient documentation

## 2014-11-24 DIAGNOSIS — T23201A Burn of second degree of right hand, unspecified site, initial encounter: Secondary | ICD-10-CM | POA: Insufficient documentation

## 2014-11-24 DIAGNOSIS — Z79899 Other long term (current) drug therapy: Secondary | ICD-10-CM | POA: Insufficient documentation

## 2014-11-24 DIAGNOSIS — Y9289 Other specified places as the place of occurrence of the external cause: Secondary | ICD-10-CM | POA: Insufficient documentation

## 2014-11-24 DIAGNOSIS — Y998 Other external cause status: Secondary | ICD-10-CM | POA: Insufficient documentation

## 2014-11-24 DIAGNOSIS — Y9389 Activity, other specified: Secondary | ICD-10-CM | POA: Diagnosis not present

## 2014-11-24 DIAGNOSIS — Z23 Encounter for immunization: Secondary | ICD-10-CM | POA: Diagnosis not present

## 2014-11-24 DIAGNOSIS — T2000XA Burn of unspecified degree of head, face, and neck, unspecified site, initial encounter: Secondary | ICD-10-CM | POA: Diagnosis not present

## 2014-11-24 DIAGNOSIS — T23202A Burn of second degree of left hand, unspecified site, initial encounter: Secondary | ICD-10-CM | POA: Diagnosis not present

## 2014-11-24 DIAGNOSIS — Z7951 Long term (current) use of inhaled steroids: Secondary | ICD-10-CM | POA: Insufficient documentation

## 2014-11-24 LAB — BASIC METABOLIC PANEL
Anion gap: 11 (ref 5–15)
BUN: 16 mg/dL (ref 6–20)
CHLORIDE: 103 mmol/L (ref 101–111)
CO2: 24 mmol/L (ref 22–32)
CREATININE: 0.93 mg/dL (ref 0.44–1.00)
Calcium: 9.1 mg/dL (ref 8.9–10.3)
GFR calc Af Amer: >60 mL/min (ref >60)
GFR calc non Af Amer: >60 mL/min (ref >60)
Glucose, Bld: 106 mg/dL — ABNORMAL HIGH (ref 65–99)
Potassium: 3.7 mmol/L (ref 3.5–5.1)
Sodium: 138 mmol/L (ref 135–145)

## 2014-11-24 LAB — CBC WITH DIFFERENTIAL/PLATELET
Basophils Absolute: 0 10*3/uL (ref 0–0.1)
Basophils Relative: 1 %
EOS ABS: 0.1 10*3/uL (ref 0–0.7)
Eosinophils Relative: 2 %
HEMATOCRIT: 40.5 % (ref 35.0–47.0)
HEMOGLOBIN: 13.7 g/dL (ref 12.0–16.0)
LYMPHS ABS: 1.5 10*3/uL (ref 1.0–3.6)
Lymphocytes Relative: 28 %
MCH: 30.6 pg (ref 26.0–34.0)
MCHC: 33.9 g/dL (ref 32.0–36.0)
MCV: 90.1 fL (ref 80.0–100.0)
MONOS PCT: 9 %
Monocytes Absolute: 0.5 10*3/uL (ref 0.2–0.9)
NEUTROS ABS: 3.2 10*3/uL (ref 1.4–6.5)
NEUTROS PCT: 60 %
Platelets: 188 10*3/uL (ref 150–440)
RBC: 4.49 MIL/uL (ref 3.80–5.20)
RDW: 13.1 % (ref 11.5–14.5)
WBC: 5.3 10*3/uL (ref 3.6–11.0)

## 2014-11-24 LAB — PROTIME-INR
INR: 0.98
Prothrombin Time: 13.2 seconds (ref 11.4–15.0)

## 2014-11-24 LAB — APTT: aPTT: 29 seconds (ref 24–36)

## 2014-11-24 MED ORDER — ONDANSETRON HCL 4 MG/2ML IJ SOLN
4.0000 mg | Freq: Once | INTRAMUSCULAR | Status: AC
  Administered 2014-11-24: 4 mg via INTRAVENOUS

## 2014-11-24 MED ORDER — TETANUS-DIPHTH-ACELL PERTUSSIS 5-2.5-18.5 LF-MCG/0.5 IM SUSP
0.5000 mL | Freq: Once | INTRAMUSCULAR | Status: AC
  Administered 2014-11-24: 0.5 mL via INTRAMUSCULAR
  Filled 2014-11-24: qty 0.5

## 2014-11-24 MED ORDER — MORPHINE SULFATE (PF) 4 MG/ML IV SOLN: 4.0000 mg | Freq: Once | INTRAVENOUS | Status: DC

## 2014-11-24 MED ORDER — MORPHINE SULFATE (PF) 4 MG/ML IV SOLN
INTRAVENOUS | Status: AC
  Administered 2014-11-24: 4 mg via INTRAVENOUS
  Filled 2014-11-24: qty 1

## 2014-11-24 MED ORDER — MORPHINE SULFATE (PF) 4 MG/ML IV SOLN
4.0000 mg | Freq: Once | INTRAVENOUS | Status: AC
  Administered 2014-11-24 (×2): 4 mg via INTRAVENOUS

## 2014-11-24 MED ORDER — SODIUM CHLORIDE 0.9 % IV BOLUS (SEPSIS)
500.0000 mL | Freq: Once | INTRAVENOUS | Status: AC
  Administered 2014-11-24: 500 mL via INTRAVENOUS

## 2014-11-24 MED ORDER — MORPHINE SULFATE (PF) 4 MG/ML IV SOLN
4.0000 mg | Freq: Once | INTRAVENOUS | Status: AC
  Administered 2014-11-24: 4 mg via INTRAVENOUS
  Filled 2014-11-24: qty 1

## 2014-11-24 MED ORDER — ONDANSETRON HCL 4 MG/2ML IJ SOLN
INTRAMUSCULAR | Status: AC
  Filled 2014-11-24: qty 2

## 2014-11-24 NOTE — ED Notes (Signed)
Report to Musician at [facility].
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
## 2014-11-24 NOTE — ED Notes (Signed)
EMS has pt on stretcher, [facility] unable to give bed assignment at this time.  Dr Edd Fabian and Elenore Rota RN charge aware of situation.

## 2014-12-05 ENCOUNTER — Other Ambulatory Visit: Payer: Self-pay

## 2014-12-05 ENCOUNTER — Ambulatory Visit: Payer: 59 | Admitting: Family Medicine

## 2014-12-05 DIAGNOSIS — T300 Burn of unspecified body region, unspecified degree: Secondary | ICD-10-CM

## 2014-12-05 DIAGNOSIS — Z1231 Encounter for screening mammogram for malignant neoplasm of breast: Secondary | ICD-10-CM

## 2014-12-09 ENCOUNTER — Ambulatory Visit: Payer: 59 | Admitting: Family Medicine

## 2014-12-16 ENCOUNTER — Ambulatory Visit (INDEPENDENT_AMBULATORY_CARE_PROVIDER_SITE_OTHER): Payer: 59 | Admitting: Physician Assistant

## 2014-12-16 ENCOUNTER — Encounter: Payer: Self-pay | Admitting: Physician Assistant

## 2014-12-16 VITALS — BP 140/88 | HR 82 | Temp 98.3°F | Ht 66.0 in | Wt 165.8 lb

## 2014-12-16 DIAGNOSIS — L732 Hidradenitis suppurativa: Secondary | ICD-10-CM | POA: Diagnosis not present

## 2014-12-16 MED ORDER — MELOXICAM 15 MG PO TABS: 15.0000 mg | ORAL_TABLET | Freq: Every day | ORAL | Status: DC

## 2014-12-16 MED ORDER — OXYCODONE-ACETAMINOPHEN 10-325 MG PO TABS: 1.0000 | ORAL_TABLET | ORAL | Status: DC | PRN

## 2014-12-16 MED ORDER — SULFAMETHOXAZOLE-TRIMETHOPRIM 800-160 MG PO TABS: 1.0000 | ORAL_TABLET | Freq: Two times a day (BID) | ORAL | Status: DC

## 2014-12-16 NOTE — Assessment & Plan Note (Signed)
Rx Bactrim. Percocet and Mobic for pain and inflammation. Supportive measures reviewed.  Follow-up Friday. ER if anything worsens.

## 2014-12-16 NOTE — Progress Notes (Signed)
Pre visit review using our clinic review tool, if applicable. No additional management support is needed unless otherwise documented below in the visit note. 

## 2014-12-16 NOTE — Progress Notes (Signed)
Patient presents to clinic today c/o pain, redness and swelling of bilateral armpits x 2 weeks. Denies fever, chills, recent trauma or injury. Endorses hx of boils but denies hx of MRSA.  Past Medical History  Diagnosis Date  . Cervical cancer [facility])     Current Outpatient Prescriptions on File Prior to Visit  Medication Sig Dispense Refill  . BIOTIN PO Take by mouth.    Marland Kitchen buPROPion (WELLBUTRIN XL) 300 MG 24 hr tablet Take 1 tablet (300 mg total) by mouth daily. 90 tablet 3  . cholecalciferol (VITAMIN D) 1000 UNITS tablet Take 2,000 Units by mouth daily.     . citalopram (CELEXA) 40 MG tablet Take 1 tablet (40 mg total) by mouth daily. 90 tablet 3  . Fish Oil OIL by Does not apply route.    . fluticasone (FLONASE) 50 MCG/ACT nasal spray Place 2 sprays into both nostrils daily. 16 g 6  . triamterene-hydrochlorothiazide (MAXZIDE-25) 37.5-25 MG per tablet Take 1 tablet by mouth daily. 90 tablet 3  . ciprofloxacin (CIPRO) 500 MG tablet Take 1 tablet (500 mg total) by mouth 2 (two) times daily. (Patient not taking: Reported on 12/16/2014) 10 tablet 0  . phenazopyridine (PYRIDIUM) 200 MG tablet Take 1 tablet (200 mg total) by mouth 3 (three) times daily as needed for pain. (Patient not taking: Reported on 12/16/2014) 10 tablet 0  . phenazopyridine (PYRIDIUM) 95 MG tablet Take 95 mg by mouth 3 (three) times daily as needed for pain.     No current facility-administered medications on file prior to visit.    Allergies  Allergen Reactions  . Vicodin [Hydrocodone-Acetaminophen] Rash    Family History  Problem Relation Age of Onset  . Prostate cancer Father     Social History   Social History  . Marital Status: Divorced    Spouse Name: N/A  . Number of Children: N/A  . Years of Education: N/A   Occupational History  . paralegal     paralegal --- foreclosure   Social History Main Topics  . Smoking status: Never Smoker   . Smokeless tobacco: Never Used  . Alcohol Use: 0.0  oz/week    0 Standard drinks or equivalent per week  . Drug Use: No  . Sexual Activity:    Partners: Male   Other Topics Concern  . None   Social History Narrative   Exercising-- walking    Review of Systems - See HPI.  All other ROS are negative.  BP 140/88 mmHg  Pulse 82  Temp(Src) 98.3 F (36.8 C) (Oral)  Ht '5\' 6"'  (1.676 m)  Wt 165 lb 12.8 oz (75.206 kg)  BMI 26.77 kg/m2  SpO2 98%  Physical Exam  Constitutional: She is oriented to person, place, and time and well-developed, well-nourished, and in no distress.  HENT:  Head: Normocephalic and atraumatic.  Eyes: Conjunctivae are normal.  Neck: Neck supple.  Cardiovascular: Normal rate, regular rhythm, normal heart sounds and intact distal pulses.   Pulmonary/Chest: Effort normal and breath sounds normal. No respiratory distress. She has no wheezes. She has no rales. She exhibits no tenderness.  Neurological: She is alert and oriented to person, place, and time.  Skin: Skin is warm and dry.     Vitals reviewed.   Recent Results (from the past 2160 hour(s))  Urine Culture     Status: None   Collection Time: 10/29/14  8:49 AM  Result Value Ref Range   Culture ESCHERICHIA COLI    Colony Count >=  100,000 COLONIES/ML    Organism ID, Bacteria ESCHERICHIA COLI       Susceptibility   Escherichia coli -  (no method available)    AMPICILLIN 4 Sensitive     AMOX/CLAVULANIC 4 Sensitive     AMPICILLIN/SULBACTAM <=2 Sensitive     PIP/TAZO <=4 Sensitive     IMIPENEM <=0.25 Sensitive     CEFTRIAXONE <=1 Sensitive     CEFTAZIDIME <=1 Sensitive     CEFEPIME <=1 Sensitive     GENTAMICIN <=1 Sensitive     TOBRAMYCIN <=1 Sensitive     CIPROFLOXACIN <=0.25 Sensitive     LEVOFLOXACIN <=0.12 Sensitive     NITROFURANTOIN <=16 Sensitive     TRIMETH/SULFA* <=20 Sensitive      * ORAL therapy:A cefazolin MIC of <32 predicts susceptibility to the oral agents cefaclor,cefdinir,cefpodoxime,cefprozil,cefuroxime,cephalexin,and loracarbef  when used for therapy of uncomplicated UTIs due to E.coli,K.pneumomiae,and P.mirabilis. PARENTERAL therapy: A cefazolinMIC of >8 indicates resistance to parenteralcefazolin. An alternate test method must beperformed to confirm susceptibility to parenteralcefazolin.  POCT Urinalysis Dipstick     Status: Abnormal   Collection Time: 10/29/14  8:51 AM  Result Value Ref Range   Color, UA orange    Clarity, UA cloudy    Glucose, UA neg    Bilirubin, UA neg    Ketones, UA neg    Spec Grav, UA 1.015    Blood, UA neg    pH, UA 7.0    Protein, UA neg    Urobilinogen, UA 1.0    Nitrite, UA positive    Leukocytes, UA moderate (2+) (A) Negative  CBC with Differential     Status: None   Collection Time: 11/24/14 11:17 AM  Result Value Ref Range   WBC 5.3 3.6 - 11.0 K/uL   RBC 4.49 3.80 - 5.20 MIL/uL   Hemoglobin 13.7 12.0 - 16.0 g/dL   HCT 40.5 35.0 - 47.0 %   MCV 90.1 80.0 - 100.0 fL   MCH 30.6 26.0 - 34.0 pg   MCHC 33.9 32.0 - 36.0 g/dL   RDW 13.1 11.5 - 14.5 %   Platelets 188 150 - 440 K/uL   Neutrophils Relative % 60 %   Neutro Abs 3.2 1.4 - 6.5 K/uL   Lymphocytes Relative 28 %   Lymphs Abs 1.5 1.0 - 3.6 K/uL   Monocytes Relative 9 %   Monocytes Absolute 0.5 0.2 - 0.9 K/uL   Eosinophils Relative 2 %   Eosinophils Absolute 0.1 0 - 0.7 K/uL   Basophils Relative 1 %   Basophils Absolute 0.0 0 - 0.1 K/uL  Basic metabolic panel     Status: Abnormal   Collection Time: 11/24/14 11:17 AM  Result Value Ref Range   Sodium 138 135 - 145 mmol/L   Potassium 3.7 3.5 - 5.1 mmol/L   Chloride 103 101 - 111 mmol/L   CO2 24 22 - 32 mmol/L   Glucose, Bld 106 (H) 65 - 99 mg/dL   BUN 16 6 - 20 mg/dL   Creatinine, Ser 0.93 0.44 - 1.00 mg/dL   Calcium 9.1 8.9 - 10.3 mg/dL   GFR calc non Af Amer >60 >60 mL/min   GFR calc Af Amer >60 >60 mL/min    Comment: (NOTE) The eGFR has been calculated using the CKD EPI equation. This calculation has not been validated in all clinical situations. eGFR's  persistently <60 mL/min signify possible Chronic Kidney Disease.    Anion gap 11 5 - 15  APTT  Status: None   Collection Time: 11/24/14 11:17 AM  Result Value Ref Range   aPTT 29 24 - 36 seconds  Protime-INR     Status: None   Collection Time: 11/24/14 11:17 AM  Result Value Ref Range   Prothrombin Time 13.2 11.4 - 15.0 seconds   INR 0.98     Assessment/Plan: Hidradenitis axillaris Rx Bactrim. Percocet and Mobic for pain and inflammation. Supportive measures reviewed.  Follow-up Friday. ER if anything worsens.

## 2014-12-16 NOTE — Patient Instructions (Signed)
Please take the antibiotic as directed. Alternate cold and warm compresses to the area. No shaving until infection has cleared. Take Mobic once daily with food for pain and inflammation. Use the Percocet later in the day as directed, if needed for pain.  Follow-up Friday. If anything worsens or fever develops, please go to the ER.  Hidradenitis Suppurativa Hidradenitis suppurativa is a long-term (chronic) skin disease that starts with blocked sweat glands or hair follicles. Bacteria may grow in these blocked openings of your skin. Hidradenitis suppurativa is like a severe form of acne that develops in areas of your body where acne would be unusual. It is most likely to affect the areas of your body where skin rubs against skin and becomes moist. This includes your:  Underarms.  Groin.  Genital areas.  Buttocks.  Upper thighs.  Breasts. Hidradenitis suppurativa may start out with small pimples. The pimples can develop into deep sores that break open (rupture) and drain pus. Over time your skin may thicken and become scarred. Hidradenitis suppurativa cannot be passed from person to person.  CAUSES  The exact cause of hidradenitis suppurativa is not known. This condition may be due to:  Female and female hormones. The condition is rare before and after puberty.  An overactive body defense system (immune system). Your immune system may overreact to the blocked hair follicles or sweat glands and cause swelling and pus-filled sores. RISK FACTORS You may have a higher risk of hidradenitis suppurativa if you:  Are a woman.  Are between ages 43 and 42.  Have a family history of hidradenitis suppurativa.  Have a personal history of acne.  Are overweight.  Smoke.  Take the drug lithium. SIGNS AND SYMPTOMS  The first signs of an outbreak are usually painful skin bumps that look like pimples. As the condition progresses:  Skin bumps may get bigger and grow deeper into the  skin.  Bumps under the skin may rupture and drain smelly pus.  Skin may become itchy and infected.  Skin may thicken and scar.  Drainage may continue through tunnels under the skin (fistulas).  Walking and moving your arms can become painful. DIAGNOSIS  Your health care provider may diagnose hidradenitis suppurativa based on your medical history and your signs and symptoms. A physical exam will also be done. You may need to see a health care provider who specializes in skin diseases (dermatologist). You may also have tests done to confirm the diagnosis. These can include:  Swabbing a sample of pus or drainage from your skin so it can be sent to the lab and tested for infection.  Blood tests to check for infection. TREATMENT  The same treatment will not work for everybody with hidradenitis suppurativa. Your treatment will depend on how severe your symptoms are. You may need to try several treatments to find what works best for you. Part of your treatment may include cleaning and bandaging (dressing) your wounds. You may also have to take medicines, such as the following:  Antibiotics.  Acne medicines.  Medicines to block or suppress the immune system.  A diabetes medicine (metformin) is sometimes used to treat this condition.  For women, birth control pills can sometimes help relieve symptoms. You may need surgery if you have a severe case of hidradenitis suppurativa that does not respond to medicine. Surgery may involve:   Using a laser to clear the skin and remove hair follicles.  Opening and draining deep sores.  Removing the areas of skin that  are diseased and scarred. HOME CARE INSTRUCTIONS  Learn as much as you can about your disease, and work closely with your health care providers.  Take medicines only as directed by your health care provider.  If you were prescribed an antibiotic medicine, finish it all even if you start to feel better.  If you are overweight,  losing weight may be very helpful. Try to reach and maintain a healthy weight.  Do not use any tobacco products, including cigarettes, chewing tobacco, or electronic cigarettes. If you need help quitting, ask your health care provider.  Do not shave the areas where you get hidradenitis suppurativa.  Do not wear deodorant.  Wear loose-fitting clothes.  Try not to overheat and get sweaty.  Take a daily bleach bath as directed by your health care provider.  Fill your bathtub halfway with water.  Pour in  cup of unscented household bleach.  Soak for 5-10 minutes.  Cover sore areas with a warm, clean washcloth (compress) for 5-10 minutes. SEEK MEDICAL CARE IF:   You have a flare-up of hidradenitis suppurativa.  You have chills or a fever.  You are having trouble controlling your symptoms at home.   This information is not intended to replace advice given to you by your health care provider. Make sure you discuss any questions you have with your health care provider.   Document Released: 09/30/2003 Document Revised: 03/08/2014 Document Reviewed: 05/18/2013 Elsevier Interactive Patient Education [employer].

## 2014-12-20 ENCOUNTER — Ambulatory Visit (INDEPENDENT_AMBULATORY_CARE_PROVIDER_SITE_OTHER): Payer: 59 | Admitting: Physician Assistant

## 2014-12-20 ENCOUNTER — Encounter: Payer: Self-pay | Admitting: Physician Assistant

## 2014-12-20 VITALS — BP 130/74 | HR 76 | Temp 98.4°F | Resp 16 | Ht 66.0 in | Wt 171.0 lb

## 2014-12-20 DIAGNOSIS — L732 Hidradenitis suppurativa: Secondary | ICD-10-CM

## 2014-12-20 MED ORDER — OXYCODONE-ACETAMINOPHEN 10-325 MG PO TABS: 1.0000 | ORAL_TABLET | ORAL | Status: DC | PRN

## 2014-12-20 MED ORDER — CLINDAMYCIN HCL 300 MG PO CAPS: 300.0000 mg | ORAL_CAPSULE | Freq: Three times a day (TID) | ORAL | Status: DC

## 2014-12-20 MED ORDER — ONDANSETRON 8 MG PO TBDP: 8.0000 mg | ORAL_TABLET | Freq: Three times a day (TID) | ORAL | Status: DC | PRN

## 2014-12-20 NOTE — Progress Notes (Signed)
Pre visit review using our clinic review tool, if applicable. No additional management support is needed unless otherwise documented below in the visit note/SLS  

## 2014-12-20 NOTE — Patient Instructions (Addendum)
Please stop the Bactrim and begin the new antibiotic. Continue pain medication as directed. You will be contacted by General Surgery for assessment. If anything worsens, go to the ER.  Hidradenitis Suppurativa Hidradenitis suppurativa is a long-term (chronic) skin disease that starts with blocked sweat glands or hair follicles. Bacteria may grow in these blocked openings of your skin. Hidradenitis suppurativa is like a severe form of acne that develops in areas of your body where acne would be unusual. It is most likely to affect the areas of your body where skin rubs against skin and becomes moist. This includes your:  Underarms.  Groin.  Genital areas.  Buttocks.  Upper thighs.  Breasts. Hidradenitis suppurativa may start out with small pimples. The pimples can develop into deep sores that break open (rupture) and drain pus. Over time your skin may thicken and become scarred. Hidradenitis suppurativa cannot be passed from person to person.  CAUSES  The exact cause of hidradenitis suppurativa is not known. This condition may be due to:  Female and female hormones. The condition is rare before and after puberty.  An overactive body defense system (immune system). Your immune system may overreact to the blocked hair follicles or sweat glands and cause swelling and pus-filled sores. RISK FACTORS You may have a higher risk of hidradenitis suppurativa if you:  Are a woman.  Are between ages 38 and 70.  Have a family history of hidradenitis suppurativa.  Have a personal history of acne.  Are overweight.  Smoke.  Take the drug lithium. SIGNS AND SYMPTOMS  The first signs of an outbreak are usually painful skin bumps that look like pimples. As the condition progresses:  Skin bumps may get bigger and grow deeper into the skin.  Bumps under the skin may rupture and drain smelly pus.  Skin may become itchy and infected.  Skin may thicken and scar.  Drainage may continue  through tunnels under the skin (fistulas).  Walking and moving your arms can become painful. DIAGNOSIS  Your health care provider may diagnose hidradenitis suppurativa based on your medical history and your signs and symptoms. A physical exam will also be done. You may need to see a health care provider who specializes in skin diseases (dermatologist). You may also have tests done to confirm the diagnosis. These can include:  Swabbing a sample of pus or drainage from your skin so it can be sent to the lab and tested for infection.  Blood tests to check for infection. TREATMENT  The same treatment will not work for everybody with hidradenitis suppurativa. Your treatment will depend on how severe your symptoms are. You may need to try several treatments to find what works best for you. Part of your treatment may include cleaning and bandaging (dressing) your wounds. You may also have to take medicines, such as the following:  Antibiotics.  Acne medicines.  Medicines to block or suppress the immune system.  A diabetes medicine (metformin) is sometimes used to treat this condition.  For women, birth control pills can sometimes help relieve symptoms. You may need surgery if you have a severe case of hidradenitis suppurativa that does not respond to medicine. Surgery may involve:   Using a laser to clear the skin and remove hair follicles.  Opening and draining deep sores.  Removing the areas of skin that are diseased and scarred. HOME CARE INSTRUCTIONS  Learn as much as you can about your disease, and work closely with your health care providers.  Take  medicines only as directed by your health care provider.  If you were prescribed an antibiotic medicine, finish it all even if you start to feel better.  If you are overweight, losing weight may be very helpful. Try to reach and maintain a healthy weight.  Do not use any tobacco products, including cigarettes, chewing tobacco, or  electronic cigarettes. If you need help quitting, ask your health care provider.  Do not shave the areas where you get hidradenitis suppurativa.  Do not wear deodorant.  Wear loose-fitting clothes.  Try not to overheat and get sweaty.  Take a daily bleach bath as directed by your health care provider.  Fill your bathtub halfway with water.  Pour in  cup of unscented household bleach.  Soak for 5-10 minutes.  Cover sore areas with a warm, clean washcloth (compress) for 5-10 minutes. SEEK MEDICAL CARE IF:   You have a flare-up of hidradenitis suppurativa.  You have chills or a fever.  You are having trouble controlling your symptoms at home.   This information is not intended to replace advice given to you by your health care provider. Make sure you discuss any questions you have with your health care provider.   Document Released: 09/30/2003 Document Revised: 03/08/2014 Document Reviewed: 05/18/2013 Elsevier Interactive Patient Education [employer].

## 2014-12-21 NOTE — Progress Notes (Signed)
Patient presents to clinic today for follow-up of hidradenitis suppurtiva after being started on Bactrim. Endorses R axillary region is improved, but states left axillary region has remained the same. Has noted new area of induration and tenderness of chin. Denies fever, chills. Is tolerating medication well. Endorses left axillary region "popped" and has been draining on its own.  Past Medical History  Diagnosis Date  . Cervical cancer [facility])     Current Outpatient Prescriptions on File Prior to Visit  Medication Sig Dispense Refill  . BIOTIN PO Take by mouth.    Marland Kitchen buPROPion (WELLBUTRIN XL) 300 MG 24 hr tablet Take 1 tablet (300 mg total) by mouth daily. 90 tablet 3  . cholecalciferol (VITAMIN D) 1000 UNITS tablet Take 2,000 Units by mouth daily.     . citalopram (CELEXA) 40 MG tablet Take 1 tablet (40 mg total) by mouth daily. 90 tablet 3  . Fish Oil OIL by Does not apply route.    . fluticasone (FLONASE) 50 MCG/ACT nasal spray Place 2 sprays into both nostrils daily. 16 g 6  . meloxicam (MOBIC) 15 MG tablet Take 1 tablet (15 mg total) by mouth daily. 30 tablet 0  . triamterene-hydrochlorothiazide (MAXZIDE-25) 37.5-25 MG per tablet Take 1 tablet by mouth daily. 90 tablet 3   No current facility-administered medications on file prior to visit.    Allergies  Allergen Reactions  . Vicodin [Hydrocodone-Acetaminophen] Rash    Family History  Problem Relation Age of Onset  . Prostate cancer Father     Social History   Social History  . Marital Status: Divorced    Spouse Name: N/A  . Number of Children: N/A  . Years of Education: N/A   Occupational History  . paralegal     paralegal --- foreclosure   Social History Main Topics  . Smoking status: Never Smoker   . Smokeless tobacco: Never Used  . Alcohol Use: 0.0 oz/week    0 Standard drinks or equivalent per week  . Drug Use: No  . Sexual Activity:    Partners: Male   Other Topics Concern  . None   Social History  Narrative   Exercising-- walking    Review of Systems - See HPI.  All other ROS are negative.  BP 130/74 mmHg  Pulse 76  Temp(Src) 98.4 F (36.9 C) (Oral)  Resp 16  Ht 5' 6" (1.676 m)  Wt 171 lb (77.565 kg)  BMI 27.61 kg/m2  SpO2 100%  Physical Exam  Constitutional: She is oriented to person, place, and time and well-developed, well-nourished, and in no distress.  HENT:  Head: Normocephalic and atraumatic.  Eyes: Conjunctivae are normal.  Neck: Neck supple.  Cardiovascular: Normal rate, regular rhythm, normal heart sounds and intact distal pulses.   Pulmonary/Chest: Effort normal and breath sounds normal. No respiratory distress. She has no wheezes. She has no rales. She exhibits no tenderness.  Neurological: She is alert and oriented to person, place, and time.  Skin: Skin is warm and dry.     Vitals reviewed.   Recent Results (from the past 2160 hour(s))  Urine Culture     Status: None   Collection Time: 10/29/14  8:49 AM  Result Value Ref Range   Culture ESCHERICHIA COLI    Colony Count >=100,000 COLONIES/ML    Organism ID, Bacteria ESCHERICHIA COLI       Susceptibility   Escherichia coli -  (no method available)    AMPICILLIN 4 Sensitive  AMOX/CLAVULANIC 4 Sensitive     AMPICILLIN/SULBACTAM <=2 Sensitive     PIP/TAZO <=4 Sensitive     IMIPENEM <=0.25 Sensitive     CEFTRIAXONE <=1 Sensitive     CEFTAZIDIME <=1 Sensitive     CEFEPIME <=1 Sensitive     GENTAMICIN <=1 Sensitive     TOBRAMYCIN <=1 Sensitive     CIPROFLOXACIN <=0.25 Sensitive     LEVOFLOXACIN <=0.12 Sensitive     NITROFURANTOIN <=16 Sensitive     TRIMETH/SULFA* <=20 Sensitive      * ORAL therapy:A cefazolin MIC of <32 predicts susceptibility to the oral agents cefaclor,cefdinir,cefpodoxime,cefprozil,cefuroxime,cephalexin,and loracarbef when used for therapy of uncomplicated UTIs due to E.coli,K.pneumomiae,and P.mirabilis. PARENTERAL therapy: A cefazolinMIC of >8 indicates resistance to  parenteralcefazolin. An alternate test method must beperformed to confirm susceptibility to parenteralcefazolin.  POCT Urinalysis Dipstick     Status: Abnormal   Collection Time: 10/29/14  8:51 AM  Result Value Ref Range   Color, UA orange    Clarity, UA cloudy    Glucose, UA neg    Bilirubin, UA neg    Ketones, UA neg    Spec Grav, UA 1.015    Blood, UA neg    pH, UA 7.0    Protein, UA neg    Urobilinogen, UA 1.0    Nitrite, UA positive    Leukocytes, UA moderate (2+) (A) Negative  CBC with Differential     Status: None   Collection Time: 11/24/14 11:17 AM  Result Value Ref Range   WBC 5.3 3.6 - 11.0 K/uL   RBC 4.49 3.80 - 5.20 MIL/uL   Hemoglobin 13.7 12.0 - 16.0 g/dL   HCT 40.5 35.0 - 47.0 %   MCV 90.1 80.0 - 100.0 fL   MCH 30.6 26.0 - 34.0 pg   MCHC 33.9 32.0 - 36.0 g/dL   RDW 13.1 11.5 - 14.5 %   Platelets 188 150 - 440 K/uL   Neutrophils Relative % 60 %   Neutro Abs 3.2 1.4 - 6.5 K/uL   Lymphocytes Relative 28 %   Lymphs Abs 1.5 1.0 - 3.6 K/uL   Monocytes Relative 9 %   Monocytes Absolute 0.5 0.2 - 0.9 K/uL   Eosinophils Relative 2 %   Eosinophils Absolute 0.1 0 - 0.7 K/uL   Basophils Relative 1 %   Basophils Absolute 0.0 0 - 0.1 K/uL  Basic metabolic panel     Status: Abnormal   Collection Time: 11/24/14 11:17 AM  Result Value Ref Range   Sodium 138 135 - 145 mmol/L   Potassium 3.7 3.5 - 5.1 mmol/L   Chloride 103 101 - 111 mmol/L   CO2 24 22 - 32 mmol/L   Glucose, Bld 106 (H) 65 - 99 mg/dL   BUN 16 6 - 20 mg/dL   Creatinine, Ser 0.93 0.44 - 1.00 mg/dL   Calcium 9.1 8.9 - 10.3 mg/dL   GFR calc non Af Amer >60 >60 mL/min   GFR calc Af Amer >60 >60 mL/min    Comment: (NOTE) The eGFR has been calculated using the CKD EPI equation. This calculation has not been validated in all clinical situations. eGFR's persistently <60 mL/min signify possible Chronic Kidney Disease.    Anion gap 11 5 - 15  APTT     Status: None   Collection Time: 11/24/14 11:17 AM    Result Value Ref Range   aPTT 29 24 - 36 seconds  Protime-INR     Status: None   Collection  Time: 11/24/14 11:17 AM  Result Value Ref Range   Prothrombin Time 13.2 11.4 - 15.0 seconds   INR 0.98     Assessment/Plan: Hidradenitis axillaris Will stop Bactrim and begin Clindamycin. Supportive measures reviewed. Pain medication refilled. Wound culture sent. Urgent referral placed to Surgery as concern for tracking abscesses. ER immediately if anything worsens over the weekend.

## 2014-12-21 NOTE — Assessment & Plan Note (Signed)
Will stop Bactrim and begin Clindamycin. Supportive measures reviewed. Pain medication refilled. Wound culture sent. Urgent referral placed to Surgery as concern for tracking abscesses. ER immediately if anything worsens over the weekend.

## 2015-01-13 ENCOUNTER — Ambulatory Visit (HOSPITAL_BASED_OUTPATIENT_CLINIC_OR_DEPARTMENT_OTHER)
Admission: RE | Admit: 2015-01-13 | Discharge: 2015-01-13 | Disposition: A | Discharge status: Home / Self Care | Payer: 59 | Source: Ambulatory Visit | Attending: Family Medicine | Admitting: Family Medicine

## 2015-01-13 DIAGNOSIS — Z1231 Encounter for screening mammogram for malignant neoplasm of breast: Secondary | ICD-10-CM | POA: Diagnosis not present

## 2015-04-18 ENCOUNTER — Encounter: Payer: 59 | Admitting: Family Medicine

## 2015-05-02 ENCOUNTER — Other Ambulatory Visit: Payer: Self-pay | Admitting: Family Medicine

## 2015-05-07 ENCOUNTER — Other Ambulatory Visit: Payer: Self-pay | Admitting: Family Medicine

## 2015-05-21 ENCOUNTER — Telehealth: Payer: Self-pay

## 2015-05-21 NOTE — Telephone Encounter (Signed)
Pre VIsit call completed.  

## 2015-05-22 ENCOUNTER — Ambulatory Visit (INDEPENDENT_AMBULATORY_CARE_PROVIDER_SITE_OTHER): Payer: BLUE CROSS/BLUE SHIELD | Admitting: Family Medicine

## 2015-05-22 ENCOUNTER — Encounter: Payer: Self-pay | Admitting: Family Medicine

## 2015-05-22 VITALS — BP 108/73 | HR 58 | Temp 98.3°F | Ht 66.0 in | Wt 170.0 lb

## 2015-05-22 DIAGNOSIS — R600 Localized edema: Secondary | ICD-10-CM

## 2015-05-22 DIAGNOSIS — Z0000 Encounter for general adult medical examination without abnormal findings: Secondary | ICD-10-CM

## 2015-05-22 DIAGNOSIS — F329 Major depressive disorder, single episode, unspecified: Secondary | ICD-10-CM | POA: Diagnosis not present

## 2015-05-22 DIAGNOSIS — R609 Edema, unspecified: Secondary | ICD-10-CM | POA: Diagnosis not present

## 2015-05-22 DIAGNOSIS — F32A Depression, unspecified: Secondary | ICD-10-CM

## 2015-05-22 LAB — CBC WITH DIFFERENTIAL/PLATELET
BASOS PCT: 1 % (ref 0.0–3.0)
Basophils Absolute: 0 10*3/uL (ref 0.0–0.1)
EOS PCT: 2.7 % (ref 0.0–5.0)
Eosinophils Absolute: 0.1 10*3/uL (ref 0.0–0.7)
HCT: 40.3 % (ref 36.0–46.0)
Hemoglobin: 13.7 g/dL (ref 12.0–15.0)
LYMPHS PCT: 37.4 % (ref 12.0–46.0)
Lymphs Abs: 1.5 10*3/uL (ref 0.7–4.0)
MCHC: 34 g/dL (ref 30.0–36.0)
MCV: 89.4 fl (ref 78.0–100.0)
MONO ABS: 0.3 10*3/uL (ref 0.1–1.0)
MONOS PCT: 7.2 % (ref 3.0–12.0)
NEUTROS PCT: 51.7 % (ref 43.0–77.0)
Neutro Abs: 2 10*3/uL (ref 1.4–7.7)
PLATELETS: 214 10*3/uL (ref 150.0–400.0)
RBC: 4.51 Mil/uL (ref 3.87–5.11)
RDW: 13.7 % (ref 11.5–15.5)
WBC: 4 10*3/uL (ref 4.0–10.5)

## 2015-05-22 LAB — COMPREHENSIVE METABOLIC PANEL
ALBUMIN: 4.1 g/dL (ref 3.5–5.2)
ALK PHOS: 79 U/L (ref 39–117)
ALT: 18 U/L (ref 0–35)
AST: 18 U/L (ref 0–37)
BUN: 17 mg/dL (ref 6–23)
CHLORIDE: 102 meq/L (ref 96–112)
CO2: 33 mEq/L — ABNORMAL HIGH (ref 19–32)
Calcium: 9.4 mg/dL (ref 8.4–10.5)
Creatinine, Ser: 0.95 mg/dL (ref 0.40–1.20)
GFR: 65.08 mL/min (ref >60.00)
Glucose, Bld: 95 mg/dL (ref 70–99)
POTASSIUM: 3.6 meq/L (ref 3.5–5.1)
SODIUM: 141 meq/L (ref 135–145)
TOTAL PROTEIN: 6.8 g/dL (ref 6.0–8.3)
Total Bilirubin: 0.5 mg/dL (ref 0.2–1.2)

## 2015-05-22 LAB — LIPID PANEL
CHOLESTEROL: 222 mg/dL — AB (ref 0–200)
HDL: 58.2 mg/dL (ref >39.00)
LDL Cholesterol: 147 mg/dL — ABNORMAL HIGH (ref 0–99)
NONHDL: 163.85
Total CHOL/HDL Ratio: 4
Triglycerides: 86 mg/dL (ref 0.0–149.0)
VLDL: 17.2 mg/dL (ref 0.0–40.0)

## 2015-05-22 LAB — TSH: TSH: 1.97 u[IU]/mL (ref 0.35–4.50)

## 2015-05-22 MED ORDER — BUPROPION HCL ER (XL) 300 MG PO TB24: 300.0000 mg | ORAL_TABLET | Freq: Every day | ORAL | Status: DC

## 2015-05-22 MED ORDER — TRIAMTERENE-HCTZ 37.5-25 MG PO TABS: 1.0000 | ORAL_TABLET | Freq: Every day | ORAL | Status: DC

## 2015-05-22 MED ORDER — CITALOPRAM HYDROBROMIDE 40 MG PO TABS: 40.0000 mg | ORAL_TABLET | Freq: Every day | ORAL | Status: DC

## 2015-05-22 NOTE — Patient Instructions (Signed)
Preventive Care for Adults, Female A healthy lifestyle and preventive care can promote health and wellness. Preventive health guidelines for women include the following key practices.  A routine yearly physical is a good way to check with your health care provider about your health and preventive screening. It is a chance to share any concerns and updates on your health and to receive a thorough exam.  Visit your dentist for a routine exam and preventive care every 6 months. Brush your teeth twice a day and floss once a day. Good oral hygiene prevents tooth decay and gum disease.  The frequency of eye exams is based on your age, health, family medical history, use of contact lenses, and other factors. Follow your health care provider's recommendations for frequency of eye exams.  Eat a healthy diet. Foods like vegetables, fruits, whole grains, low-fat dairy products, and lean protein foods contain the nutrients you need without too many calories. Decrease your intake of foods high in solid fats, added sugars, and salt. Eat the right amount of calories for you.Get information about a proper diet from your health care provider, if necessary.  Regular physical exercise is one of the most important things you can do for your health. Most adults should get at least 150 minutes of moderate-intensity exercise (any activity that increases your heart rate and causes you to sweat) each week. In addition, most adults need muscle-strengthening exercises on 2 or more days a week.  Maintain a healthy weight. The body mass index (BMI) is a screening tool to identify possible weight problems. It provides an estimate of body fat based on height and weight. Your health care provider can find your BMI and can help you achieve or maintain a healthy weight.For adults 20 years and older:  A BMI below 18.5 is considered underweight.  A BMI of 18.5 to 24.9 is normal.  A BMI of 25 to 29.9 is considered overweight.  A  BMI of 30 and above is considered obese.  Maintain normal blood lipids and cholesterol levels by exercising and minimizing your intake of saturated fat. Eat a balanced diet with plenty of fruit and vegetables. Blood tests for lipids and cholesterol should begin at age 22 and be repeated every 5 years. If your lipid or cholesterol levels are high, you are over 50, or you are at high risk for heart disease, you may need your cholesterol levels checked more frequently.Ongoing high lipid and cholesterol levels should be treated with medicines if diet and exercise are not working.  If you smoke, find out from your health care provider how to quit. If you do not use tobacco, do not start.  Lung cancer screening is recommended for adults aged 44-80 years who are at high risk for developing lung cancer because of a history of smoking. A yearly low-dose CT scan of the lungs is recommended for people who have at least a 30-pack-year history of smoking and are a current smoker or have quit within the past 15 years. A pack year of smoking is smoking an average of 1 pack of cigarettes a day for 1 year (for example: 1 pack a day for 30 years or 2 packs a day for 15 years). Yearly screening should continue until the smoker has stopped smoking for at least 15 years. Yearly screening should be stopped for people who develop a health problem that would prevent them from having lung cancer treatment.  If you are pregnant, do not drink alcohol. If you are  breastfeeding, be very cautious about drinking alcohol. If you are not pregnant and choose to drink alcohol, do not have more than 1 drink per day. One drink is considered to be 12 ounces (355 mL) of beer, 5 ounces (148 mL) of wine, or 1.5 ounces (44 mL) of liquor.  Avoid use of street drugs. Do not share needles with anyone. Ask for help if you need support or instructions about stopping the use of drugs.  High blood pressure causes heart disease and increases the risk  of stroke. Your blood pressure should be checked at least every 1 to 2 years. Ongoing high blood pressure should be treated with medicines if weight loss and exercise do not work.  If you are 55-79 years old, ask your health care provider if you should take aspirin to prevent strokes.  Diabetes screening is done by taking a blood sample to check your blood glucose level after you have not eaten for a certain period of time (fasting). If you are not overweight and you do not have risk factors for diabetes, you should be screened once every 3 years starting at age 45. If you are overweight or obese and you are 40-70 years of age, you should be screened for diabetes every year as part of your cardiovascular risk assessment.  Breast cancer screening is essential preventive care for women. You should practice "breast self-awareness." This means understanding the normal appearance and feel of your breasts and may include breast self-examination. Any changes detected, no matter how small, should be reported to a health care provider. Women in their 20s and 30s should have a clinical breast exam (CBE) by a health care provider as part of a regular health exam every 1 to 3 years. After age 40, women should have a CBE every year. Starting at age 40, women should consider having a mammogram (breast X-ray test) every year. Women who have a family history of breast cancer should talk to their health care provider about genetic screening. Women at a high risk of breast cancer should talk to their health care providers about having an MRI and a mammogram every year.  Breast cancer gene (BRCA)-related cancer risk assessment is recommended for women who have family members with BRCA-related cancers. BRCA-related cancers include breast, ovarian, tubal, and peritoneal cancers. Having family members with these cancers may be associated with an increased risk for harmful changes (mutations) in the breast cancer genes BRCA1 and  BRCA2. Results of the assessment will determine the need for genetic counseling and BRCA1 and BRCA2 testing.  Your health care provider may recommend that you be screened regularly for cancer of the pelvic organs (ovaries, uterus, and vagina). This screening involves a pelvic examination, including checking for microscopic changes to the surface of your cervix (Pap test). You may be encouraged to have this screening done every 3 years, beginning at age 21.  For women ages 30-65, health care providers may recommend pelvic exams and Pap testing every 3 years, or they may recommend the Pap and pelvic exam, combined with testing for human papilloma virus (HPV), every 5 years. Some types of HPV increase your risk of cervical cancer. Testing for HPV may also be done on women of any age with unclear Pap test results.  Other health care providers may not recommend any screening for nonpregnant women who are considered low risk for pelvic cancer and who do not have symptoms. Ask your health care provider if a screening pelvic exam is right for   you.  If you have had past treatment for cervical cancer or a condition that could lead to cancer, you need Pap tests and screening for cancer for at least 20 years after your treatment. If Pap tests have been discontinued, your risk factors (such as having a new sexual partner) need to be reassessed to determine if screening should resume. Some women have medical problems that increase the chance of getting cervical cancer. In these cases, your health care provider may recommend more frequent screening and Pap tests.  Colorectal cancer can be detected and often prevented. Most routine colorectal cancer screening begins at the age of 77 years and continues through age 63 years. However, your health care provider may recommend screening at an earlier age if you have risk factors for colon cancer. On a yearly basis, your health care provider may provide home test kits to check  for hidden blood in the stool. Use of a small camera at the end of a tube, to directly examine the colon (sigmoidoscopy or colonoscopy), can detect the earliest forms of colorectal cancer. Talk to your health care provider about this at age 39, when routine screening begins. Direct exam of the colon should be repeated every 5-10 years through age 51 years, unless early forms of precancerous polyps or small growths are found.  People who are at an increased risk for hepatitis B should be screened for this virus. You are considered at high risk for hepatitis B if:  You were born in a country where hepatitis B occurs often. Talk with your health care provider about which countries are considered high risk.  Your parents were born in a high-risk country and you have not received a shot to protect against hepatitis B (hepatitis B vaccine).  You have HIV or AIDS.  You use needles to inject street drugs.  You live with, or have sex with, someone who has hepatitis B.  You get hemodialysis treatment.  You take certain medicines for conditions like cancer, organ transplantation, and autoimmune conditions.  Hepatitis C blood testing is recommended for all people born from [birth year] through 1965 and any individual with known risks for hepatitis C.  Practice safe sex. Use condoms and avoid high-risk sexual practices to reduce the spread of sexually transmitted infections (STIs). STIs include gonorrhea, chlamydia, syphilis, trichomonas, herpes, HPV, and human immunodeficiency virus (HIV). Herpes, HIV, and HPV are viral illnesses that have no cure. They can result in disability, cancer, and death.  You should be screened for sexually transmitted illnesses (STIs) including gonorrhea and chlamydia if:  You are sexually active and are younger than 24 years.  You are older than 24 years and your health care provider tells you that you are at risk for this type of infection.  Your sexual activity has changed  since you were last screened and you are at an increased risk for chlamydia or gonorrhea. Ask your health care provider if you are at risk.  If you are at risk of being infected with HIV, it is recommended that you take a prescription medicine daily to prevent HIV infection. This is called preexposure prophylaxis (PrEP). You are considered at risk if:  You are sexually active and do not regularly use condoms or know the HIV status of your partner(s).  You take drugs by injection.  You are sexually active with a partner who has HIV.  Talk with your health care provider about whether you are at high risk of being infected with HIV. If  you choose to begin PrEP, you should first be tested for HIV. You should then be tested every 3 months for as long as you are taking PrEP.  Osteoporosis is a disease in which the bones lose minerals and strength with aging. This can result in serious bone fractures or breaks. The risk of osteoporosis can be identified using a bone density scan. Women ages 67 years and over and women at risk for fractures or osteoporosis should discuss screening with their health care providers. Ask your health care provider whether you should take a calcium supplement or vitamin D to reduce the rate of osteoporosis.  Menopause can be associated with physical symptoms and risks. Hormone replacement therapy is available to decrease symptoms and risks. You should talk to your health care provider about whether hormone replacement therapy is right for you.  Use sunscreen. Apply sunscreen liberally and repeatedly throughout the day. You should seek shade when your shadow is shorter than you. Protect yourself by wearing long sleeves, pants, a wide-brimmed hat, and sunglasses year round, whenever you are outdoors.  Once a month, do a whole body skin exam, using a mirror to look at the skin on your back. Tell your health care provider of new moles, moles that have irregular borders, moles that  are larger than a pencil eraser, or moles that have changed in shape or color.  Stay current with required vaccines (immunizations).  Influenza vaccine. All adults should be immunized every year.  Tetanus, diphtheria, and acellular pertussis (Td, Tdap) vaccine. Pregnant women should receive 1 dose of Tdap vaccine during each pregnancy. The dose should be obtained regardless of the length of time since the last dose. Immunization is preferred during the 27th-36th week of gestation. An adult who has not previously received Tdap or who does not know her vaccine status should receive 1 dose of Tdap. This initial dose should be followed by tetanus and diphtheria toxoids (Td) booster doses every 10 years. Adults with an unknown or incomplete history of completing a 3-dose immunization series with Td-containing vaccines should begin or complete a primary immunization series including a Tdap dose. Adults should receive a Td booster every 10 years.  Varicella vaccine. An adult without evidence of immunity to varicella should receive 2 doses or a second dose if she has previously received 1 dose. Pregnant females who do not have evidence of immunity should receive the first dose after pregnancy. This first dose should be obtained before leaving the health care facility. The second dose should be obtained 4-8 weeks after the first dose.  Human papillomavirus (HPV) vaccine. Females aged 13-26 years who have not received the vaccine previously should obtain the 3-dose series. The vaccine is not recommended for use in pregnant females. However, pregnancy testing is not needed before receiving a dose. If a female is found to be pregnant after receiving a dose, no treatment is needed. In that case, the remaining doses should be delayed until after the pregnancy. Immunization is recommended for any person with an immunocompromised condition through the age of 61 years if she did not get any or all doses earlier. During the  3-dose series, the second dose should be obtained 4-8 weeks after the first dose. The third dose should be obtained 24 weeks after the first dose and 16 weeks after the second dose.  Zoster vaccine. One dose is recommended for adults aged 30 years or older unless certain conditions are present.  Measles, mumps, and rubella (MMR) vaccine. Adults born  before 1957 generally are considered immune to measles and mumps. Adults born in [birth year] or later should have 1 or more doses of MMR vaccine unless there is a contraindication to the vaccine or there is laboratory evidence of immunity to each of the three diseases. A routine second dose of MMR vaccine should be obtained at least 28 days after the first dose for students attending postsecondary schools, health care workers, or international travelers. People who received inactivated measles vaccine or an unknown type of measles vaccine during 1963-1967 should receive 2 doses of MMR vaccine. People who received inactivated mumps vaccine or an unknown type of mumps vaccine before 1979 and are at high risk for mumps infection should consider immunization with 2 doses of MMR vaccine. For females of childbearing age, rubella immunity should be determined. If there is no evidence of immunity, females who are not pregnant should be vaccinated. If there is no evidence of immunity, females who are pregnant should delay immunization until after pregnancy. Unvaccinated health care workers born before [birth year] who lack laboratory evidence of measles, mumps, or rubella immunity or laboratory confirmation of disease should consider measles and mumps immunization with 2 doses of MMR vaccine or rubella immunization with 1 dose of MMR vaccine.  Pneumococcal 13-valent conjugate (PCV13) vaccine. When indicated, a person who is uncertain of his immunization history and has no record of immunization should receive the PCV13 vaccine. All adults 29 years of age and older should receive this  vaccine. An adult aged 65 years or older who has certain medical conditions and has not been previously immunized should receive 1 dose of PCV13 vaccine. This PCV13 should be followed with a dose of pneumococcal polysaccharide (PPSV23) vaccine. Adults who are at high risk for pneumococcal disease should obtain the PPSV23 vaccine at least 8 weeks after the dose of PCV13 vaccine. Adults older than 55 years of age who have normal immune system function should obtain the PPSV23 vaccine dose at least 1 year after the dose of PCV13 vaccine.  Pneumococcal polysaccharide (PPSV23) vaccine. When PCV13 is also indicated, PCV13 should be obtained first. All adults aged 67 years and older should be immunized. An adult younger than age 46 years who has certain medical conditions should be immunized. Any person who resides in a nursing home or long-term care facility should be immunized. An adult smoker should be immunized. People with an immunocompromised condition and certain other conditions should receive both PCV13 and PPSV23 vaccines. People with human immunodeficiency virus (HIV) infection should be immunized as soon as possible after diagnosis. Immunization during chemotherapy or radiation therapy should be avoided. Routine use of PPSV23 vaccine is not recommended for American Indians, [place] Natives, or people younger than 65 years unless there are medical conditions that require PPSV23 vaccine. When indicated, people who have unknown immunization and have no record of immunization should receive PPSV23 vaccine. One-time revaccination 5 years after the first dose of PPSV23 is recommended for people aged 19-64 years who have chronic kidney failure, nephrotic syndrome, asplenia, or immunocompromised conditions. People who received 1-2 doses of PPSV23 before age 67 years should receive another dose of PPSV23 vaccine at age 68 years or later if at least 5 years have passed since the previous dose. Doses of PPSV23 are not  needed for people immunized with PPSV23 at or after age 53 years.  Meningococcal vaccine. Adults with asplenia or persistent complement component deficiencies should receive 2 doses of quadrivalent meningococcal conjugate (MenACWY-D) vaccine. The doses should be obtained  at least 2 months apart. Microbiologists working with certain meningococcal bacteria, [place] recruits, people at risk during an outbreak, and people who travel to or live in countries with a high rate of meningitis should be immunized. A first-year college student up through age 34 years who is living in a residence hall should receive a dose if she did not receive a dose on or after her 16th birthday. Adults who have certain high-risk conditions should receive one or more doses of vaccine.  Hepatitis A vaccine. Adults who wish to be protected from this disease, have certain high-risk conditions, work with hepatitis A-infected animals, work in hepatitis A research labs, or travel to or work in countries with a high rate of hepatitis A should be immunized. Adults who were previously unvaccinated and who anticipate close contact with an international adoptee during the first 60 days after arrival in the [place] from a country with a high rate of hepatitis A should be immunized.  Hepatitis B vaccine. Adults who wish to be protected from this disease, have certain high-risk conditions, may be exposed to blood or other infectious body fluids, are household contacts or sex partners of hepatitis B positive people, are clients or workers in certain care facilities, or travel to or work in countries with a high rate of hepatitis B should be immunized.  Haemophilus influenzae type b (Hib) vaccine. A previously unvaccinated person with asplenia or sickle cell disease or having a scheduled splenectomy should receive 1 dose of Hib vaccine. Regardless of previous immunization, a recipient of a hematopoietic stem cell transplant should receive a  3-dose series 6-12 months after her successful transplant. Hib vaccine is not recommended for adults with HIV infection. Preventive Services / Frequency Ages 35 to 4 years  Blood pressure check.** / Every 3-5 years.  Lipid and cholesterol check.** / Every 5 years beginning at age 60.  Clinical breast exam.** / Every 3 years for women in their 71s and 10s.  BRCA-related cancer risk assessment.** / For women who have family members with a BRCA-related cancer (breast, ovarian, tubal, or peritoneal cancers).  Pap test.** / Every 2 years from ages 76 through 26. Every 3 years starting at age 61 through age 76 or 93 with a history of 3 consecutive normal Pap tests.  HPV screening.** / Every 3 years from ages 37 through ages 60 to 51 with a history of 3 consecutive normal Pap tests.  Hepatitis C blood test.** / For any individual with known risks for hepatitis C.  Skin self-exam. / Monthly.  Influenza vaccine. / Every year.  Tetanus, diphtheria, and acellular pertussis (Tdap, Td) vaccine.** / Consult your health care provider. Pregnant women should receive 1 dose of Tdap vaccine during each pregnancy. 1 dose of Td every 10 years.  Varicella vaccine.** / Consult your health care provider. Pregnant females who do not have evidence of immunity should receive the first dose after pregnancy.  HPV vaccine. / 3 doses over 6 months, if 93 and younger. The vaccine is not recommended for use in pregnant females. However, pregnancy testing is not needed before receiving a dose.  Measles, mumps, rubella (MMR) vaccine.** / You need at least 1 dose of MMR if you were born in 1957 or later. You may also need a 2nd dose. For females of childbearing age, rubella immunity should be determined. If there is no evidence of immunity, females who are not pregnant should be vaccinated. If there is no evidence of immunity, females who are  pregnant should delay immunization until after pregnancy.  Pneumococcal  13-valent conjugate (PCV13) vaccine.** / Consult your health care provider.  Pneumococcal polysaccharide (PPSV23) vaccine.** / 1 to 2 doses if you smoke cigarettes or if you have certain conditions.  Meningococcal vaccine.** / 1 dose if you are age 31 to 83 years and a Market researcher living in a residence hall, or have one of several medical conditions, you need to get vaccinated against meningococcal disease. You may also need additional booster doses.  Hepatitis A vaccine.** / Consult your health care provider.  Hepatitis B vaccine.** / Consult your health care provider.  Haemophilus influenzae type b (Hib) vaccine.** / Consult your health care provider. Ages 70 to 10 years  Blood pressure check.** / Every year.  Lipid and cholesterol check.** / Every 5 years beginning at age 70 years.  Lung cancer screening. / Every year if you are aged 102-80 years and have a 30-pack-year history of smoking and currently smoke or have quit within the past 15 years. Yearly screening is stopped once you have quit smoking for at least 15 years or develop a health problem that would prevent you from having lung cancer treatment.  Clinical breast exam.** / Every year after age 73 years.  BRCA-related cancer risk assessment.** / For women who have family members with a BRCA-related cancer (breast, ovarian, tubal, or peritoneal cancers).  Mammogram.** / Every year beginning at age 59 years and continuing for as long as you are in good health. Consult with your health care provider.  Pap test.** / Every 3 years starting at age 110 years through age 95 or 72 years with a history of 3 consecutive normal Pap tests.  HPV screening.** / Every 3 years from ages 1 years through ages 70 to 60 years with a history of 3 consecutive normal Pap tests.  Fecal occult blood test (FOBT) of stool. / Every year beginning at age 73 years and continuing until age 16 years. You may not need to do this test if you get  a colonoscopy every 10 years.  Flexible sigmoidoscopy or colonoscopy.** / Every 5 years for a flexible sigmoidoscopy or every 10 years for a colonoscopy beginning at age 52 years and continuing until age 67 years.  Hepatitis C blood test.** / For all people born from [birth year] through 1965 and any individual with known risks for hepatitis C.  Skin self-exam. / Monthly.  Influenza vaccine. / Every year.  Tetanus, diphtheria, and acellular pertussis (Tdap/Td) vaccine.** / Consult your health care provider. Pregnant women should receive 1 dose of Tdap vaccine during each pregnancy. 1 dose of Td every 10 years.  Varicella vaccine.** / Consult your health care provider. Pregnant females who do not have evidence of immunity should receive the first dose after pregnancy.  Zoster vaccine.** / 1 dose for adults aged 3 years or older.  Measles, mumps, rubella (MMR) vaccine.** / You need at least 1 dose of MMR if you were born in 1957 or later. You may also need a second dose. For females of childbearing age, rubella immunity should be determined. If there is no evidence of immunity, females who are not pregnant should be vaccinated. If there is no evidence of immunity, females who are pregnant should delay immunization until after pregnancy.  Pneumococcal 13-valent conjugate (PCV13) vaccine.** / Consult your health care provider.  Pneumococcal polysaccharide (PPSV23) vaccine.** / 1 to 2 doses if you smoke cigarettes or if you have certain conditions.  Meningococcal vaccine.** /  Consult your health care provider.  Hepatitis A vaccine.** / Consult your health care provider.  Hepatitis B vaccine.** / Consult your health care provider.  Haemophilus influenzae type b (Hib) vaccine.** / Consult your health care provider. Ages 64 years and over  Blood pressure check.** / Every year.  Lipid and cholesterol check.** / Every 5 years beginning at age 23 years.  Lung cancer screening. / Every year if you  are aged 16-80 years and have a 30-pack-year history of smoking and currently smoke or have quit within the past 15 years. Yearly screening is stopped once you have quit smoking for at least 15 years or develop a health problem that would prevent you from having lung cancer treatment.  Clinical breast exam.** / Every year after age 74 years.  BRCA-related cancer risk assessment.** / For women who have family members with a BRCA-related cancer (breast, ovarian, tubal, or peritoneal cancers).  Mammogram.** / Every year beginning at age 44 years and continuing for as long as you are in good health. Consult with your health care provider.  Pap test.** / Every 3 years starting at age 58 years through age 22 or 39 years with 3 consecutive normal Pap tests. Testing can be stopped between 65 and 70 years with 3 consecutive normal Pap tests and no abnormal Pap or HPV tests in the past 10 years.  HPV screening.** / Every 3 years from ages 64 years through ages 70 or 61 years with a history of 3 consecutive normal Pap tests. Testing can be stopped between 65 and 70 years with 3 consecutive normal Pap tests and no abnormal Pap or HPV tests in the past 10 years.  Fecal occult blood test (FOBT) of stool. / Every year beginning at age 40 years and continuing until age 27 years. You may not need to do this test if you get a colonoscopy every 10 years.  Flexible sigmoidoscopy or colonoscopy.** / Every 5 years for a flexible sigmoidoscopy or every 10 years for a colonoscopy beginning at age 7 years and continuing until age 32 years.  Hepatitis C blood test.** / For all people born from [birth year] through 1965 and any individual with known risks for hepatitis C.  Osteoporosis screening.** / A one-time screening for women ages 30 years and over and women at risk for fractures or osteoporosis.  Skin self-exam. / Monthly.  Influenza vaccine. / Every year.  Tetanus, diphtheria, and acellular pertussis (Tdap/Td)  vaccine.** / 1 dose of Td every 10 years.  Varicella vaccine.** / Consult your health care provider.  Zoster vaccine.** / 1 dose for adults aged 35 years or older.  Pneumococcal 13-valent conjugate (PCV13) vaccine.** / Consult your health care provider.  Pneumococcal polysaccharide (PPSV23) vaccine.** / 1 dose for all adults aged 46 years and older.  Meningococcal vaccine.** / Consult your health care provider.  Hepatitis A vaccine.** / Consult your health care provider.  Hepatitis B vaccine.** / Consult your health care provider.  Haemophilus influenzae type b (Hib) vaccine.** / Consult your health care provider. ** Family history and personal history of risk and conditions may change your health care provider's recommendations.   This information is not intended to replace advice given to you by your health care provider. Make sure you discuss any questions you have with your health care provider.   Document Released: 04/13/2001 Document Revised: 03/08/2014 Document Reviewed: 07/13/2010 Elsevier Interactive Patient Education [employer].

## 2015-05-22 NOTE — Progress Notes (Signed)
Pre visit review using our clinic review tool, if applicable. No additional management support is needed unless otherwise documented below in the visit note. 

## 2015-05-25 NOTE — Progress Notes (Signed)
Subjective:     [name] is a 55 y.o. female and is here for a comprehensive physical exam. The patient reports no problems.  Social History   Social History  . Marital Status: Divorced    Spouse Name: N/A  . Number of Children: N/A  . Years of Education: N/A   Occupational History  . paralegal     paralegal --- foreclosure   Social History Main Topics  . Smoking status: Never Smoker   . Smokeless tobacco: Never Used  . Alcohol Use: 0.0 oz/week    0 Standard drinks or equivalent per week  . Drug Use: No  . Sexual Activity:    Partners: Male   Other Topics Concern  . Not on file   Social History Narrative   Exercising-- walking    Health Maintenance  Topic Date Due  . INFLUENZA VACCINE  12/16/2015 (Originally 09/30/2014)  . MAMMOGRAM  01/13/2016  . PAP SMEAR  02/12/2018  . COLONOSCOPY  07/14/2023  . TETANUS/TDAP  11/23/2024  . Hepatitis C Screening  Completed  . HIV Screening  Completed    The following portions of the patient's history were reviewed and updated as appropriate:  She  has a past medical history of Cervical cancer ([place]). She  does not have any pertinent problems on file. She  has past surgical history that includes Laparoscopic radical total hysterectomy w/ node biopsy and Abdominal hysterectomy. Her family history includes Prostate cancer in her father. She  reports that she has never smoked. She has never used smokeless tobacco. She reports that she drinks alcohol. She reports that she does not use illicit drugs. She has a current medication list which includes the following prescription(s): biotin, bupropion, cholecalciferol, citalopram, fish oil, fluticasone, and triamterene-hydrochlorothiazide. Current Outpatient Prescriptions on File Prior to Visit  Medication Sig Dispense Refill  . BIOTIN PO Take by mouth.    . cholecalciferol (VITAMIN D) 1000 UNITS tablet Take 2,000 Units by mouth daily.     . Fish Oil OIL by Does not apply route.    .  fluticasone (FLONASE) 50 MCG/ACT nasal spray Place 2 sprays into both nostrils daily. 16 g 6   No current facility-administered medications on file prior to visit.   She is allergic to vicodin..  Review of Systems Review of Systems  Constitutional: Negative for activity change, appetite change and fatigue.  HENT: Negative for hearing loss, congestion, tinnitus and ear discharge.  dentist q35m Eyes: Negative for visual disturbance (see optho q1y -- vision corrected to 20/20 with glasses).  Respiratory: Negative for cough, chest tightness and shortness of breath.   Cardiovascular: Negative for chest pain, palpitations and leg swelling.  Gastrointestinal: Negative for abdominal pain, diarrhea, constipation and abdominal distention.  Genitourinary: Negative for urgency, frequency, decreased urine volume and difficulty urinating.  Musculoskeletal: Negative for back pain, arthralgias and gait problem.  Skin: Negative for color change, pallor and rash.  Neurological: Negative for dizziness, light-headedness, numbness and headaches.  Hematological: Negative for adenopathy. Does not bruise/bleed easily.  Psychiatric/Behavioral: Negative for suicidal ideas, confusion, sleep disturbance, self-injury, dysphoric mood, decreased concentration and agitation.       Objective:    BP 108/73 mmHg  Pulse 58  Temp(Src) 98.3 F (36.8 C) (Oral)  Ht 5\' 6"  (1.676 m)  Wt 170 lb (77.111 kg)  BMI 27.45 kg/m2  SpO2 100% General appearance: alert, cooperative, appears stated age and no distress Head: Normocephalic, without obvious abnormality, atraumatic Eyes: conjunctivae/corneas clear. PERRL, EOM's intact. Fundi benign.  Ears: normal TM's and external ear canals both ears Nose: Nares normal. Septum midline. Mucosa normal. No drainage or sinus tenderness. Throat: lips, mucosa, and tongue normal; teeth and gums normal Neck: no adenopathy, no carotid bruit, no JVD, supple, symmetrical, trachea midline and  thyroid not enlarged, symmetric, no tenderness/mass/nodules Back: symmetric, no curvature. ROM normal. No CVA tenderness. Lungs: clear to auscultation bilaterally Breasts: gyn Heart: regular rate and rhythm, S1, S2 normal, no murmur, click, rub or gallop Abdomen: soft, non-tender; bowel sounds normal; no masses,  no organomegaly Pelvic: deferred--gyn Extremities: extremities normal, atraumatic, no cyanosis or edema Pulses: 2+ and symmetric Skin: Skin color, texture, turgor normal. No rashes or lesions Lymph nodes: Cervical, supraclavicular, and axillary nodes normal. Neurologic: Alert and oriented X 3, normal strength and tone. Normal symmetric reflexes. Normal coordination and gait Psych- no depression, no anxiety      Assessment:    Healthy female exam.       Plan:  Check labs    ghm utd See After Visit Summary for Counseling Recommendations    1. Depression   - buPROPion (WELLBUTRIN XL) 300 MG 24 hr tablet; Take 1 tablet (300 mg total) by mouth daily.  Dispense: 90 tablet; Refill: 3 - citalopram (CELEXA) 40 MG tablet; Take 1 tablet (40 mg total) by mouth daily.  Dispense: 90 tablet; Refill: 3  2. Edema extremities   - triamterene-hydrochlorothiazide (MAXZIDE-25) 37.5-25 MG tablet; Take 1 tablet by mouth daily.  Dispense: 90 tablet; Refill: 3 - Comprehensive metabolic panel - CBC with Differential/Platelet - Lipid panel - TSH  3. Preventative health care   - Comprehensive metabolic panel - CBC with Differential/Platelet - Lipid panel - TSH

## 2015-06-03 ENCOUNTER — Telehealth: Payer: Self-pay

## 2015-06-03 DIAGNOSIS — E785 Hyperlipidemia, unspecified: Secondary | ICD-10-CM

## 2015-06-03 MED ORDER — SIMVASTATIN 20 MG PO TABS: 20.0000 mg | ORAL_TABLET | Freq: Every day | ORAL | Status: DC

## 2015-06-03 NOTE — Telephone Encounter (Signed)
-----   Message from Ann Held, DO sent at 05/29/2015  8:45 PM EDT ----- Cholesterol--- LDL goal < 100,  HDL >40,  TG < 150.  Diet and exercise will increase HDL and decrease LDL and TG.  Fish,  Fish Oil, Flaxseed oil will also help increase the HDL and decrease Triglycerides.   Recheck labs in 3 months If you have been working on diet and exercise----- start zocor 20 mg 1 every night.

## 2015-06-03 NOTE — Telephone Encounter (Signed)
Rx sent to pharmacy.  Lab ordered.

## 2015-09-05 ENCOUNTER — Other Ambulatory Visit (INDEPENDENT_AMBULATORY_CARE_PROVIDER_SITE_OTHER): Payer: BLUE CROSS/BLUE SHIELD

## 2015-09-05 DIAGNOSIS — E785 Hyperlipidemia, unspecified: Secondary | ICD-10-CM

## 2015-09-05 LAB — LIPID PANEL
CHOLESTEROL: 209 mg/dL — AB (ref 0–200)
HDL: 53.4 mg/dL (ref >39.00)
LDL CALC: 135 mg/dL — AB (ref 0–99)
NONHDL: 155.31
Total CHOL/HDL Ratio: 4
Triglycerides: 100 mg/dL (ref 0.0–149.0)
VLDL: 20 mg/dL (ref 0.0–40.0)

## 2015-11-11 ENCOUNTER — Ambulatory Visit (INDEPENDENT_AMBULATORY_CARE_PROVIDER_SITE_OTHER): Payer: BLUE CROSS/BLUE SHIELD | Admitting: Family

## 2015-11-11 ENCOUNTER — Telehealth: Payer: Self-pay | Admitting: Family Medicine

## 2015-11-11 VITALS — BP 122/86 | HR 62 | Temp 97.6°F | Resp 18 | Ht 66.0 in | Wt 172.0 lb

## 2015-11-11 DIAGNOSIS — R300 Dysuria: Secondary | ICD-10-CM | POA: Diagnosis not present

## 2015-11-11 DIAGNOSIS — N3000 Acute cystitis without hematuria: Secondary | ICD-10-CM

## 2015-11-11 LAB — POCT URINALYSIS DIPSTICK
BILIRUBIN UA: NEGATIVE
GLUCOSE UA: NEGATIVE
KETONES UA: NEGATIVE
NITRITE UA: NEGATIVE
PH UA: 6.5
Protein, UA: NEGATIVE
RBC UA: NEGATIVE
SPEC GRAV UA: 1.02
Urobilinogen, UA: NEGATIVE

## 2015-11-11 MED ORDER — CIPROFLOXACIN HCL 500 MG PO TABS: 500.0000 mg | ORAL_TABLET | Freq: Two times a day (BID) | ORAL | 0 refills | Status: DC

## 2015-11-11 MED FILL — CIPROFLOXACIN HCL 500 MG TA: 500 | 7 days supply | Qty: 14 | Fill #0

## 2015-11-11 NOTE — Progress Notes (Signed)
Subjective:    Patient ID: [name], female    DOB: [birth year], 55 y.o.   MRN: [id number]  HPI  [name] is a 55 yr old female who presents today with chief complaint of dysuria and urinary frequency. Symptoms began on 11/09/15.  She has taken 2 tablets of bactrim that she had on hand without relief thus far. Denies fever, hematuria. Does have some mild right sided low back pain.    Review of Systems See HPI  Past Medical History:  Diagnosis Date  . Cervical cancer [facility])      Social History   Social History  . Marital status: Divorced    Spouse name: N/A  . Number of children: N/A  . Years of education: N/A   Occupational History  . paralegal     paralegal --- foreclosure   Social History Main Topics  . Smoking status: Never Smoker  . Smokeless tobacco: Never Used  . Alcohol use 0.0 oz/week  . Drug use: No  . Sexual activity: Yes    Partners: Male   Other Topics Concern  . Not on file   Social History Narrative   Exercising-- walking     Past Surgical History:  Procedure Laterality Date  . ABDOMINAL HYSTERECTOMY    . LAPAROSCOPIC RADICAL TOTAL HYSTERECTOMY W/ NODE BIOPSY      Family History  Problem Relation Age of Onset  . Prostate cancer Father     Allergies  Allergen Reactions  . Vicodin [Hydrocodone-Acetaminophen] Rash    Current Outpatient Prescriptions on File Prior to Visit  Medication Sig Dispense Refill  . BIOTIN PO Take by mouth.    Marland Kitchen buPROPion (WELLBUTRIN XL) 300 MG 24 hr tablet Take 1 tablet (300 mg total) by mouth daily. 90 tablet 3  . cholecalciferol (VITAMIN D) 1000 UNITS tablet Take 2,000 Units by mouth daily.     . citalopram (CELEXA) 40 MG tablet Take 1 tablet (40 mg total) by mouth daily. 90 tablet 3  . Fish Oil OIL by Does not apply route.    . fluticasone (FLONASE) 50 MCG/ACT nasal spray Place 2 sprays into both nostrils daily. 16 g 6  . simvastatin (ZOCOR) 20 MG tablet Take 1 tablet (20 mg total) by mouth at bedtime. 90  tablet 3  . triamterene-hydrochlorothiazide (MAXZIDE-25) 37.5-25 MG tablet Take 1 tablet by mouth daily. 90 tablet 3   No current facility-administered medications on file prior to visit.     BP 122/86 (BP Location: Right Arm, Cuff Size: Normal)   Pulse 62   Temp 97.6 F (36.4 C) (Oral)   Resp 18   Ht 5\' 6"  (1.676 m)   Wt 172 lb (78 kg)   SpO2 98% Comment: room air  BMI 27.76 kg/m       Objective:   Physical Exam  Constitutional: She appears well-developed and well-nourished.  Cardiovascular: Normal rate, regular rhythm and normal heart sounds.   No murmur heard. Pulmonary/Chest: Effort normal and breath sounds normal. No respiratory distress. She has no wheezes.  Abdominal: There is tenderness in the suprapubic area. There is no CVA tenderness.  Psychiatric: She has a normal mood and affect. Her behavior is normal. Judgment and thought content normal.          Assessment & Plan:  UTI- New. + leuks on UA. Not improving with bactrim. will rx with cipro x 7 days to cover for pyelo due to c/o low back pain.  Await urine culture.  Advised pt  as follows:  Call if symptoms worsen, if you develop fever >101 or if you are not feeling better in 2-3 days.  You may use AZO as needed for pain.

## 2015-11-11 NOTE — Progress Notes (Signed)
Pre visit review using our clinic review tool, if applicable. No additional management support is needed unless otherwise documented below in the visit note. 

## 2015-11-11 NOTE — Telephone Encounter (Signed)
error 

## 2015-11-11 NOTE — Patient Instructions (Signed)
Please begin cipro for urinary tract infection.  Call if symptoms worsen, if you develop fever >101 or if you are not feeling better in 2-3 days.  You may use AZO as needed for pain.

## 2015-11-13 LAB — URINE CULTURE: Organism ID, Bacteria: 10000

## 2015-11-14 ENCOUNTER — Encounter: Payer: Self-pay | Admitting: Family

## 2016-03-02 ENCOUNTER — Other Ambulatory Visit: Payer: Self-pay | Admitting: Gynecology

## 2016-03-02 DIAGNOSIS — N6489 Other specified disorders of breast: Secondary | ICD-10-CM

## 2016-03-05 ENCOUNTER — Other Ambulatory Visit: Payer: Self-pay | Admitting: Gynecology

## 2016-03-05 ENCOUNTER — Ambulatory Visit
Admission: RE | Admit: 2016-03-05 | Discharge: 2016-03-05 | Disposition: A | Discharge status: Home / Self Care | Payer: BLUE CROSS/BLUE SHIELD | Source: Ambulatory Visit | Attending: Gynecology | Admitting: Gynecology

## 2016-03-05 DIAGNOSIS — N6489 Other specified disorders of breast: Secondary | ICD-10-CM

## 2016-03-11 ENCOUNTER — Other Ambulatory Visit: Payer: Self-pay | Admitting: Gynecology

## 2016-03-11 ENCOUNTER — Ambulatory Visit
Admission: RE | Admit: 2016-03-11 | Discharge: 2016-03-11 | Disposition: A | Discharge status: Home / Self Care | Payer: BLUE CROSS/BLUE SHIELD | Source: Ambulatory Visit | Attending: Gynecology | Admitting: Gynecology

## 2016-03-11 DIAGNOSIS — N6489 Other specified disorders of breast: Secondary | ICD-10-CM

## 2016-03-25 ENCOUNTER — Other Ambulatory Visit: Payer: Self-pay | Admitting: General Surgery

## 2016-03-25 ENCOUNTER — Encounter (HOSPITAL_BASED_OUTPATIENT_CLINIC_OR_DEPARTMENT_OTHER)
Admission: RE | Admit: 2016-03-25 | Discharge: 2016-03-25 | Disposition: A | Discharge status: Home / Self Care | Payer: BLUE CROSS/BLUE SHIELD | Source: Ambulatory Visit | Attending: General Surgery | Admitting: General Surgery

## 2016-03-25 ENCOUNTER — Other Ambulatory Visit: Payer: Self-pay

## 2016-03-25 ENCOUNTER — Encounter (HOSPITAL_BASED_OUTPATIENT_CLINIC_OR_DEPARTMENT_OTHER): Payer: Self-pay | Admitting: *Deleted

## 2016-03-25 DIAGNOSIS — Z01818 Encounter for other preprocedural examination: Secondary | ICD-10-CM | POA: Insufficient documentation

## 2016-03-25 DIAGNOSIS — R928 Other abnormal and inconclusive findings on diagnostic imaging of breast: Secondary | ICD-10-CM | POA: Insufficient documentation

## 2016-03-25 DIAGNOSIS — Z01812 Encounter for preprocedural laboratory examination: Secondary | ICD-10-CM | POA: Insufficient documentation

## 2016-03-25 DIAGNOSIS — Z79899 Other long term (current) drug therapy: Secondary | ICD-10-CM | POA: Insufficient documentation

## 2016-03-25 LAB — BASIC METABOLIC PANEL
ANION GAP: 8 (ref 5–15)
BUN: 16 mg/dL (ref 6–20)
CHLORIDE: 102 mmol/L (ref 101–111)
CO2: 30 mmol/L (ref 22–32)
Calcium: 9.6 mg/dL (ref 8.9–10.3)
Creatinine, Ser: 1.01 mg/dL — ABNORMAL HIGH (ref 0.44–1.00)
GFR calc Af Amer: >60 mL/min (ref >60)
GLUCOSE: 88 mg/dL (ref 65–99)
POTASSIUM: 4.2 mmol/L (ref 3.5–5.1)
Sodium: 140 mmol/L (ref 135–145)

## 2016-03-25 NOTE — Progress Notes (Signed)
Boost drink given with instructions, pt verbalized understanding. 

## 2016-03-30 ENCOUNTER — Ambulatory Visit
Admission: RE | Admit: 2016-03-30 | Discharge: 2016-03-30 | Disposition: A | Discharge status: Home / Self Care | Payer: BLUE CROSS/BLUE SHIELD | Source: Ambulatory Visit | Attending: General Surgery | Admitting: General Surgery

## 2016-03-30 DIAGNOSIS — R928 Other abnormal and inconclusive findings on diagnostic imaging of breast: Secondary | ICD-10-CM

## 2016-04-01 ENCOUNTER — Ambulatory Visit
Admission: RE | Admit: 2016-04-01 | Discharge: 2016-04-01 | Disposition: A | Discharge status: Home / Self Care | Payer: BLUE CROSS/BLUE SHIELD | Source: Ambulatory Visit | Attending: General Surgery | Admitting: General Surgery

## 2016-04-01 ENCOUNTER — Encounter (HOSPITAL_BASED_OUTPATIENT_CLINIC_OR_DEPARTMENT_OTHER): Payer: Self-pay

## 2016-04-01 ENCOUNTER — Ambulatory Visit (HOSPITAL_BASED_OUTPATIENT_CLINIC_OR_DEPARTMENT_OTHER): Payer: BLUE CROSS/BLUE SHIELD | Admitting: Anesthesiology

## 2016-04-01 ENCOUNTER — Encounter (HOSPITAL_BASED_OUTPATIENT_CLINIC_OR_DEPARTMENT_OTHER)
Admission: RE | Disposition: A | Discharge status: Home / Self Care | Payer: Self-pay | Source: Ambulatory Visit | Attending: General Surgery

## 2016-04-01 ENCOUNTER — Ambulatory Visit (HOSPITAL_BASED_OUTPATIENT_CLINIC_OR_DEPARTMENT_OTHER)
Admission: RE | Admit: 2016-04-01 | Discharge: 2016-04-01 | Disposition: A | Discharge status: Home / Self Care | Payer: BLUE CROSS/BLUE SHIELD | Source: Ambulatory Visit | Attending: General Surgery | Admitting: General Surgery

## 2016-04-01 DIAGNOSIS — R928 Other abnormal and inconclusive findings on diagnostic imaging of breast: Secondary | ICD-10-CM

## 2016-04-01 DIAGNOSIS — K219 Gastro-esophageal reflux disease without esophagitis: Secondary | ICD-10-CM | POA: Diagnosis not present

## 2016-04-01 DIAGNOSIS — Z90710 Acquired absence of both cervix and uterus: Secondary | ICD-10-CM | POA: Diagnosis not present

## 2016-04-01 DIAGNOSIS — N6091 Unspecified benign mammary dysplasia of right breast: Secondary | ICD-10-CM | POA: Insufficient documentation

## 2016-04-01 DIAGNOSIS — Z885 Allergy status to narcotic agent status: Secondary | ICD-10-CM | POA: Diagnosis not present

## 2016-04-01 DIAGNOSIS — Z8541 Personal history of malignant neoplasm of cervix uteri: Secondary | ICD-10-CM | POA: Insufficient documentation

## 2016-04-01 HISTORY — DX: Anxiety disorder, unspecified: F41.9

## 2016-04-01 HISTORY — PX: RADIOACTIVE SEED GUIDED EXCISIONAL BREAST BIOPSY: SHX6490

## 2016-04-01 SURGERY — RADIOACTIVE SEED GUIDED BREAST BIOPSY
Anesthesia: General | Site: Breast | Laterality: Right

## 2016-04-01 MED ORDER — PROPOFOL 10 MG/ML IV BOLUS
INTRAVENOUS | Status: DC | PRN
  Administered 2016-04-01: 160 mg via INTRAVENOUS

## 2016-04-01 MED ORDER — PHENYLEPHRINE 40 MCG/ML (10ML) SYRINGE FOR IV PUSH (FOR BLOOD PRESSURE SUPPORT)
PREFILLED_SYRINGE | INTRAVENOUS | Status: AC
  Filled 2016-04-01: qty 10

## 2016-04-01 MED ORDER — MIDAZOLAM HCL 2 MG/2ML IJ SOLN
INTRAMUSCULAR | Status: AC
  Filled 2016-04-01: qty 2

## 2016-04-01 MED ORDER — LIDOCAINE 2% (20 MG/ML) 5 ML SYRINGE
INTRAMUSCULAR | Status: AC
  Filled 2016-04-01: qty 5

## 2016-04-01 MED ORDER — FENTANYL CITRATE (PF) 100 MCG/2ML IJ SOLN: 25.0000 ug | INTRAMUSCULAR | Status: DC | PRN

## 2016-04-01 MED ORDER — DEXAMETHASONE SODIUM PHOSPHATE 10 MG/ML IJ SOLN
INTRAMUSCULAR | Status: AC
  Filled 2016-04-01: qty 1

## 2016-04-01 MED ORDER — DEXAMETHASONE SODIUM PHOSPHATE 4 MG/ML IJ SOLN
INTRAMUSCULAR | Status: DC | PRN
  Administered 2016-04-01: 10 mg via INTRAVENOUS

## 2016-04-01 MED ORDER — KETOROLAC TROMETHAMINE 30 MG/ML IJ SOLN
INTRAMUSCULAR | Status: DC | PRN
  Administered 2016-04-01: 30 mg via INTRAVENOUS

## 2016-04-01 MED ORDER — CEFAZOLIN SODIUM-DEXTROSE 2-4 GM/100ML-% IV SOLN
INTRAVENOUS | Status: AC
  Filled 2016-04-01: qty 100

## 2016-04-01 MED ORDER — ACETAMINOPHEN 500 MG PO TABS
1000.0000 mg | ORAL_TABLET | ORAL | Status: AC
  Administered 2016-04-01: 1000 mg via ORAL

## 2016-04-01 MED ORDER — SCOPOLAMINE 1 MG/3DAYS TD PT72: 1.0000 | MEDICATED_PATCH | Freq: Once | TRANSDERMAL | Status: DC | PRN

## 2016-04-01 MED ORDER — CEFAZOLIN SODIUM-DEXTROSE 2-4 GM/100ML-% IV SOLN
2.0000 g | INTRAVENOUS | Status: AC
  Administered 2016-04-01: 2 g via INTRAVENOUS

## 2016-04-01 MED ORDER — GABAPENTIN 300 MG PO CAPS
ORAL_CAPSULE | ORAL | Status: AC
  Filled 2016-04-01: qty 1

## 2016-04-01 MED ORDER — TRAMADOL HCL 50 MG PO TABS: 50.0000 mg | ORAL_TABLET | Freq: Four times a day (QID) | ORAL | 0 refills | Status: DC | PRN

## 2016-04-01 MED ORDER — ONDANSETRON HCL 4 MG/2ML IJ SOLN
INTRAMUSCULAR | Status: DC | PRN
  Administered 2016-04-01: 4 mg via INTRAVENOUS

## 2016-04-01 MED ORDER — KETOROLAC TROMETHAMINE 30 MG/ML IJ SOLN
INTRAMUSCULAR | Status: AC
  Filled 2016-04-01: qty 1

## 2016-04-01 MED ORDER — LIDOCAINE 2% (20 MG/ML) 5 ML SYRINGE
INTRAMUSCULAR | Status: DC | PRN
  Administered 2016-04-01: 50 mg via INTRAVENOUS

## 2016-04-01 MED ORDER — MIDAZOLAM HCL 2 MG/2ML IJ SOLN
1.0000 mg | INTRAMUSCULAR | Status: DC | PRN
  Administered 2016-04-01: 2 mg via INTRAVENOUS

## 2016-04-01 MED ORDER — LACTATED RINGERS IV SOLN
INTRAVENOUS | Status: DC
  Administered 2016-04-01: 09:00:00 via INTRAVENOUS

## 2016-04-01 MED ORDER — FENTANYL CITRATE (PF) 100 MCG/2ML IJ SOLN
INTRAMUSCULAR | Status: AC
  Filled 2016-04-01: qty 2

## 2016-04-01 MED ORDER — FENTANYL CITRATE (PF) 100 MCG/2ML IJ SOLN
50.0000 ug | INTRAMUSCULAR | Status: DC | PRN
  Administered 2016-04-01: 100 ug via INTRAVENOUS

## 2016-04-01 MED ORDER — GABAPENTIN 300 MG PO CAPS
300.0000 mg | ORAL_CAPSULE | ORAL | Status: AC
  Administered 2016-04-01: 300 mg via ORAL

## 2016-04-01 MED ORDER — ACETAMINOPHEN 500 MG PO TABS
ORAL_TABLET | ORAL | Status: AC
  Filled 2016-04-01: qty 2

## 2016-04-01 MED ORDER — EPHEDRINE SULFATE 50 MG/ML IJ SOLN
INTRAMUSCULAR | Status: DC | PRN
  Administered 2016-04-01 (×2): 10 mg via INTRAVENOUS

## 2016-04-01 MED ORDER — BUPIVACAINE HCL (PF) 0.25 % IJ SOLN
INTRAMUSCULAR | Status: DC | PRN
  Administered 2016-04-01: 20 mL

## 2016-04-01 MED ORDER — ONDANSETRON HCL 4 MG/2ML IJ SOLN
INTRAMUSCULAR | Status: AC
  Filled 2016-04-01: qty 2

## 2016-04-01 MED ORDER — EPHEDRINE 5 MG/ML INJ
INTRAVENOUS | Status: AC
  Filled 2016-04-01: qty 10

## 2016-04-01 MED ORDER — CELECOXIB 200 MG PO CAPS
ORAL_CAPSULE | ORAL | Status: AC
Start: 2016-04-01 — End: 2016-04-01
  Filled 2016-04-01: qty 2

## 2016-04-01 SURGICAL SUPPLY — 59 items
BINDER BREAST LRG (GAUZE/BANDAGES/DRESSINGS) ×2 IMPLANT
BINDER BREAST MEDIUM (GAUZE/BANDAGES/DRESSINGS) IMPLANT
BINDER BREAST XLRG (GAUZE/BANDAGES/DRESSINGS) IMPLANT
BINDER BREAST XXLRG (GAUZE/BANDAGES/DRESSINGS) IMPLANT
BLADE SURG 15 STRL LF DISP TIS (BLADE) ×1 IMPLANT
BLADE SURG 15 STRL SS (BLADE) ×1
CANISTER SUC SOCK COL 7IN (MISCELLANEOUS) IMPLANT
CANISTER SUCT 1200ML W/VALVE (MISCELLANEOUS) IMPLANT
CHLORAPREP W/TINT 26ML (MISCELLANEOUS) ×2 IMPLANT
CLIP TI WIDE RED SMALL 6 (CLIP) ×2 IMPLANT
COVER BACK TABLE 60X90IN (DRAPES) ×2 IMPLANT
COVER MAYO STAND STRL (DRAPES) ×2 IMPLANT
COVER PROBE W GEL 5X96 (DRAPES) ×2 IMPLANT
DECANTER SPIKE VIAL GLASS SM (MISCELLANEOUS) IMPLANT
DERMABOND ADVANCED (GAUZE/BANDAGES/DRESSINGS) ×1
DERMABOND ADVANCED .7 DNX12 (GAUZE/BANDAGES/DRESSINGS) ×1 IMPLANT
DEVICE DUBIN W/COMP PLATE 8390 (MISCELLANEOUS) ×2 IMPLANT
DRAPE LAPAROSCOPIC ABDOMINAL (DRAPES) ×2 IMPLANT
DRAPE UTILITY XL STRL (DRAPES) ×2 IMPLANT
DRSG TEGADERM 4X4.75 (GAUZE/BANDAGES/DRESSINGS) IMPLANT
ELECT COATED BLADE 2.86 ST (ELECTRODE) ×2 IMPLANT
ELECT REM PT RETURN 9FT ADLT (ELECTROSURGICAL) ×2
ELECTRODE REM PT RTRN 9FT ADLT (ELECTROSURGICAL) ×1 IMPLANT
GLOVE BIO SURGEON STRL SZ7 (GLOVE) ×4 IMPLANT
GLOVE BIOGEL PI IND STRL 6.5 (GLOVE) ×1 IMPLANT
GLOVE BIOGEL PI IND STRL 7.0 (GLOVE) ×3 IMPLANT
GLOVE BIOGEL PI IND STRL 7.5 (GLOVE) ×1 IMPLANT
GLOVE BIOGEL PI INDICATOR 6.5 (GLOVE) ×1
GLOVE BIOGEL PI INDICATOR 7.0 (GLOVE) ×3
GLOVE BIOGEL PI INDICATOR 7.5 (GLOVE) ×1
GLOVE SURG SS PI 7.0 STRL IVOR (GLOVE) ×2 IMPLANT
GOWN STRL REUS W/ TWL LRG LVL3 (GOWN DISPOSABLE) ×3 IMPLANT
GOWN STRL REUS W/TWL LRG LVL3 (GOWN DISPOSABLE) ×3
HEMOSTAT ARISTA ABSORB 3G PWDR (MISCELLANEOUS) ×2 IMPLANT
ILLUMINATOR WAVEGUIDE N/F (MISCELLANEOUS) ×2 IMPLANT
KIT MARKER MARGIN INK (KITS) ×2 IMPLANT
LIGHT WAVEGUIDE WIDE FLAT (MISCELLANEOUS) IMPLANT
NEEDLE HYPO 25X1 1.5 SAFETY (NEEDLE) ×2 IMPLANT
NS IRRIG 1000ML POUR BTL (IV SOLUTION) IMPLANT
PACK BASIN DAY SURGERY FS (CUSTOM PROCEDURE TRAY) ×2 IMPLANT
PENCIL BUTTON HOLSTER BLD 10FT (ELECTRODE) ×2 IMPLANT
SLEEVE SCD COMPRESS KNEE MED (MISCELLANEOUS) ×2 IMPLANT
SLEEVE SURGEON STRL (DRAPES) ×2 IMPLANT
SPONGE GAUZE 4X4 12PLY STER LF (GAUZE/BANDAGES/DRESSINGS) IMPLANT
SPONGE LAP 4X18 X RAY DECT (DISPOSABLE) ×2 IMPLANT
STRIP CLOSURE SKIN 1/2X4 (GAUZE/BANDAGES/DRESSINGS) ×2 IMPLANT
SUT MNCRL AB 4-0 PS2 18 (SUTURE) IMPLANT
SUT MON AB 5-0 PS2 18 (SUTURE) ×2 IMPLANT
SUT SILK 2 0 SH (SUTURE) IMPLANT
SUT VIC AB 2-0 SH 27 (SUTURE) ×1
SUT VIC AB 2-0 SH 27XBRD (SUTURE) ×1 IMPLANT
SUT VIC AB 3-0 SH 27 (SUTURE) ×1
SUT VIC AB 3-0 SH 27X BRD (SUTURE) ×1 IMPLANT
SUT VIC AB 5-0 PS2 18 (SUTURE) IMPLANT
SYR CONTROL 10ML LL (SYRINGE) ×2 IMPLANT
TOWEL OR 17X24 6PK STRL BLUE (TOWEL DISPOSABLE) ×2 IMPLANT
TOWEL OR NON WOVEN STRL DISP B (DISPOSABLE) ×2 IMPLANT
TUBE CONNECTING 20X1/4 (TUBING) IMPLANT
YANKAUER SUCT BULB TIP NO VENT (SUCTIONS) IMPLANT

## 2016-04-01 NOTE — Discharge Instructions (Signed)
[facility] °Office Phone Number [phone] ° °POST OP INSTRUCTIONS ° °Always review your discharge instruction sheet given to you by the facility where your surgery was performed. ° °IF YOU HAVE DISABILITY OR FAMILY LEAVE FORMS, YOU MUST BRING THEM TO THE OFFICE FOR PROCESSING.  DO NOT GIVE THEM TO YOUR DOCTOR. ° °1. A prescription for pain medication may be given to you upon discharge.  Take your pain medication as prescribed, if needed.  If narcotic pain medicine is not needed, then you may take acetaminophen (Tylenol), naprosyn (Alleve) or ibuprofen (Advil) as needed. °2. Take your usually prescribed medications unless otherwise directed °3. If you need a refill on your pain medication, please contact your pharmacy.  They will contact our office to request authorization.  Prescriptions will not be filled after 5pm or on week-ends. °4. You should eat very light the first 24 hours after surgery, such as soup, crackers, pudding, etc.  Resume your normal diet the day after surgery. °5. Most patients will experience some swelling and bruising in the breast.  Ice packs and a good support bra will help.  Wear the breast binder provided or a sports bra for 72 hours day and night.  After that wear a sports bra during the day until you return to the office. Swelling and bruising can take several days to resolve.  °6. It is common to experience some constipation if taking pain medication after surgery.  Increasing fluid intake and taking a stool softener will usually help or prevent this problem from occurring.  A mild laxative (Milk of Magnesia or Miralax) should be taken according to package directions if there are no bowel movements after 48 hours. °7. Unless discharge instructions indicate otherwise, you may remove your bandages 48 hours after surgery and you may shower at that time.  You may have steri-strips (small skin tapes) in place directly over the incision.  These strips should be left on the  skin for 7-10 days and will come off on their own.  If your surgeon used skin glue on the incision, you may shower in 24 hours.  The glue will flake off over the next 2-3 weeks.  Any sutures or staples will be removed at the office during your follow-up visit. °8. ACTIVITIES:  You may resume regular daily activities (gradually increasing) beginning the next day.  Wearing a good support bra or sports bra minimizes pain and swelling.  You may have sexual intercourse when it is comfortable. °a. You may drive when you no longer are taking prescription pain medication, you can comfortably wear a seatbelt, and you can safely maneuver your car and apply brakes. °b. RETURN TO WORK:  ______________________________________________________________________________________ °9. You should see your doctor in the office for a follow-up appointment approximately two weeks after your surgery.  Your doctor’s nurse will typically make your follow-up appointment when she calls you with your pathology report.  Expect your pathology report 3-4 business days after your surgery.  You may call to check if you do not hear from us after three days. °10. OTHER INSTRUCTIONS: _______________________________________________________________________________________________ _____________________________________________________________________________________________________________________________________ °_____________________________________________________________________________________________________________________________________ °_____________________________________________________________________________________________________________________________________ ° °WHEN TO CALL DR WAKEFIELD: °1. Fever over 101.0 °2. Nausea and/or vomiting. °3. Extreme swelling or bruising. °4. Continued bleeding from incision. °5. Increased pain, redness, or drainage from the incision. ° °The clinic staff is available to answer your questions during regular  business hours.  Please don’t hesitate to call and ask to speak to one of the nurses for clinical concerns.  If   you have a medical emergency, go to the nearest emergency room or call 911.  A surgeon from [facility] is always on call at the hospital. ° °For further questions, please visit [email or web address] ° ° ° °Post Anesthesia Home Care Instructions ° °Activity: °Get plenty of rest for the remainder of the day. A responsible adult should stay with you for 24 hours following the procedure.  °For the next 24 hours, DO NOT: °-Drive a car °-Operate machinery °-Drink alcoholic beverages °-Take any medication unless instructed by your physician °-Make any legal decisions or sign important papers. ° °Meals: °Start with liquid foods such as gelatin or soup. Progress to regular foods as tolerated. Avoid greasy, spicy, heavy foods. If nausea and/or vomiting occur, drink only clear liquids until the nausea and/or vomiting subsides. Call your physician if vomiting continues. ° °Special Instructions/Symptoms: °Your throat may feel dry or sore from the anesthesia or the breathing tube placed in your throat during surgery. If this causes discomfort, gargle with warm salt water. The discomfort should disappear within 24 hours. ° °If you had a scopolamine patch placed behind your ear for the management of post- operative nausea and/or vomiting: ° °1. The medication in the patch is effective for 72 hours, after which it should be removed.  Wrap patch in a tissue and discard in the trash. Wash hands thoroughly with soap and water. °2. You may remove the patch earlier than 72 hours if you experience unpleasant side effects which may include dry mouth, dizziness or visual disturbances. °3. Avoid touching the patch. Wash your hands with soap and water after contact with the patch. °  ° °

## 2016-04-01 NOTE — Anesthesia Preprocedure Evaluation (Signed)
Anesthesia Evaluation  Patient identified by MRN, date of birth, ID band Patient awake    Reviewed: Allergy & Precautions, NPO status , Patient's Chart, lab work & pertinent test results  Airway Mallampati: II  TM Distance: >3 FB     Dental   Pulmonary neg pulmonary ROS,    breath sounds clear to auscultation       Cardiovascular negative cardio ROS   Rhythm:Regular Rate:Normal     Neuro/Psych    GI/Hepatic negative GI ROS, Neg liver ROS,   Endo/Other  negative endocrine ROS  Renal/GU negative Renal ROS     Musculoskeletal   Abdominal   Peds  Hematology   Anesthesia Other Findings   Reproductive/Obstetrics                             Anesthesia Physical Anesthesia Plan  ASA: II  Anesthesia Plan: General   Post-op Pain Management:    Induction: Intravenous  Airway Management Planned: LMA  Additional Equipment:   Intra-op Plan:   Post-operative Plan: Extubation in OR  Informed Consent: I have reviewed the patients History and Physical, chart, labs and discussed the procedure including the risks, benefits and alternatives for the proposed anesthesia with the patient or authorized representative who has indicated his/her understanding and acceptance.   Dental advisory given  Plan Discussed with: CRNA and Anesthesiologist  Anesthesia Plan Comments:         Anesthesia Quick Evaluation

## 2016-04-01 NOTE — Anesthesia Procedure Notes (Signed)
Procedure Name: LMA Insertion Performed by: Tavien Chestnut W Pre-anesthesia Checklist: Patient identified, Emergency Drugs available, Suction available and Patient being monitored Patient Re-evaluated:Patient Re-evaluated prior to inductionOxygen Delivery Method: Circle system utilized Preoxygenation: Pre-oxygenation with 100% oxygen Intubation Type: IV induction Ventilation: Mask ventilation without difficulty LMA: LMA inserted LMA Size: 4.0 Number of attempts: 1 Placement Confirmation: positive ETCO2 Tube secured with: Tape Dental Injury: Teeth and Oropharynx as per pre-operative assessment        

## 2016-04-01 NOTE — Transfer of Care (Signed)
Immediate Anesthesia Transfer of Care Note  Patient: [name]  Procedure(s) Performed: Procedure(s): RADIOACTIVE SEED GUIDED EXCISIONAL BREAST BIOPSY (Right)  Patient Location: PACU  Anesthesia Type:General  Level of Consciousness: awake and sedated  Airway & Oxygen Therapy: Patient Spontanous Breathing and Patient connected to face mask oxygen  Post-op Assessment: Report given to RN and Post -op Vital signs reviewed and stable  Post vital signs: Reviewed and stable  Last Vitals:  Vitals:   04/01/16 0725  BP: 130/73  Pulse: 72  Resp: 18  Temp: 36.9 C    Last Pain:  Vitals:   04/01/16 0725  TempSrc: Oral         Complications: No apparent anesthesia complications

## 2016-04-01 NOTE — Anesthesia Postprocedure Evaluation (Signed)
Anesthesia Post Note  Patient: [name]  Procedure(s) Performed: Procedure(s) (LRB): RADIOACTIVE SEED GUIDED EXCISIONAL BREAST BIOPSY (Right)  Patient location during evaluation: PACU Anesthesia Type: General Level of consciousness: awake Pain management: pain level controlled Vital Signs Assessment: post-procedure vital signs reviewed and stable Respiratory status: spontaneous breathing Cardiovascular status: stable Anesthetic complications: no       Last Vitals:  Vitals:   04/01/16 0725  BP: 130/73  Pulse: 72  Resp: 18  Temp: 36.9 C    Last Pain:  Vitals:   04/01/16 0725  TempSrc: Oral                 Ebelyn Bohnet

## 2016-04-01 NOTE — Interval H&P Note (Signed)
History and Physical Interval Note:  04/01/2016 8:29 AM  [name]  has presented today for surgery, with the diagnosis of RIGHT BREAST ABNORMAL MAMMOGRAM  The various methods of treatment have been discussed with the patient and family. After consideration of risks, benefits and other options for treatment, the patient has consented to  Procedure(s): RADIOACTIVE SEED GUIDED EXCISIONAL BREAST BIOPSY (Right) as a surgical intervention .  The patient's history has been reviewed, patient examined, no change in status, stable for surgery.  I have reviewed the patient's chart and labs.  Questions were answered to the patient's satisfaction.     [name]

## 2016-04-01 NOTE — Op Note (Signed)
Preoperative diagnoses: Right breast mammographic lesion with discordant biopsy Postoperative diagnosis: Same as above Procedure:Rightbreast seed guided excisional biopsy Surgeon: Dr. Serita Grammes Anesthesia: Gen. Estimated blood loss: 50 cc Complications: None Drains: None Specimens:Right breast tissue with paint Sponge and needle count correct at completion Disposition to recovery stable  Indications: This is a 31 yof who underwent mm with abnormality that underwent core biopsy that was discordant. We discussed excisional biopsy using seed guidance. She had seed placed prior to beginning and the mammograms were in the room.   Procedure: After informed consent was obtained she was then taken to the operating room. She was given cefazolin. Sequential compression devices were on her legs. She was placed under general anesthesia without complication. Her rightbreast was then prepped and draped in the standard sterile surgical fashion. A surgical timeout was then performed.  I located the radioactive seed with the neoprobe. I infiltrated marcaine in the area of the seed as well as around the areola. I made an incision on areolar border in an effort to hide her scar. I tunneled to the lesion with the lighted retractors. I then used the neoprobe to guide the excision of the seed and surrounding tissue. This was confirmed by the neoprobe. This was then taken for mammogram which confirmed removal of the seed and the clip. This was confirmed by radiology. This was then sent to pathology. Hemostasis was observed.I closed the breast tissue with a 2-0 Vicryl. The dermis was closed with 3-0 Vicryl and the skin with 5-0 Monocryl.Dermabond and steristrips were placed on the incision. A breast binder was placed. She was transferred to recovery stable

## 2016-04-01 NOTE — H&P (Signed)
56 yof referred by Eve Key for newly found right breast distortion. she has no family or personal history. she has no mass or dc. she had a screening mm that shows density c breasts. there is distortion that persists. 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 this is discordant and she is referred for biopsy   Past Surgical History Nance Pear, [place]; 03/25/2016 9:31 AM) Breast Biopsy  Right. Hysterectomy (due to cancer) - Partial  Knee Surgery  Right. Tonsillectomy   Diagnostic Studies History Nance Pear, [place]; 03/25/2016 9:31 AM) Colonoscopy  1-5 years ago Mammogram  within last year Pap Smear  1-5 years ago  Allergies Nance Pear, CMA; 03/25/2016 9:31 AM) Vicodin *ANALGESICS - OPIOID*  Rash Allergies Reconciled   Medication History Nance Pear, CMA; 03/25/2016 9:33 AM) BuPROPion HCl ER (XL) (300MG  Tablet ER 24HR, Oral daily) Active. Biotin po otc (daily) Active. Vitamin D (Cholecalciferol) (1000UNIT Tablet, Oral daily) Active. CeleXA (40MG  Tablet, Oral daily) Active. fish oil Active. Flonase (50MCG/DOSE Inhaler, Nasal as needed) Active. Simvastatin (20MG  Tablet, Oral daily) Active. Triamterene-HCTZ (37.5-25MG  Tablet, Oral daily) Active. Medications Reconciled  Social History Nance Pear, [place]; 03/25/2016 9:31 AM) Alcohol use  Moderate alcohol use. Caffeine use  Coffee, Tea. No drug use  Tobacco use  Never smoker.  Family History Nance Pear, [place]; 03/25/2016 9:31 AM) Hypertension  Father. Prostate Cancer  Father.  Pregnancy / Birth History Nance Pear, [place]; 03/25/2016 9:31 AM) Age at menarche  44 years. Age of menopause  51-55 Contraceptive History  Contraceptive implant, Oral contraceptives. Gravida  1 Length (months) of breastfeeding  3-6 Maternal age  39-30 Para  1  Other Problems Nance Pear, [place]; 03/25/2016 9:31 AM) Anxiety Disorder  Back Pain  Bladder Problems  Cervical Cancer  Gastroesophageal Reflux Disease   Hemorrhoids     Review of Systems Nance Pear CMA; 03/25/2016 9:31 AM) General Present- Night Sweats and Weight Gain. Not Present- Appetite Loss, Chills, Fatigue, Fever and Weight Loss. HEENT Present- Wears glasses/contact lenses. Not Present- Earache, Hearing Loss, Hoarseness, Nose Bleed, Oral Ulcers, Ringing in the Ears, Seasonal Allergies, Sinus Pain, Sore Throat, Visual Disturbances and Yellow Eyes. Respiratory Not Present- Bloody sputum, Chronic Cough, Difficulty Breathing, Snoring and Wheezing. Breast Not Present- Breast Mass, Breast Pain, Nipple Discharge and Skin Changes. Cardiovascular Not Present- Chest Pain, Difficulty Breathing Lying Down, Leg Cramps, Palpitations, Rapid Heart Rate, Shortness of Breath and Swelling of Extremities. Gastrointestinal Not Present- Abdominal Pain, Bloating, Bloody Stool, Change in Bowel Habits, Chronic diarrhea, Constipation, Difficulty Swallowing, Excessive gas, Gets full quickly at meals, Hemorrhoids, Indigestion, Nausea, Rectal Pain and Vomiting. Female Genitourinary Not Present- Frequency, Nocturia, Painful Urination, Pelvic Pain and Urgency. Musculoskeletal Present- Back Pain and Joint Pain. Not Present- Joint Stiffness, Muscle Pain, Muscle Weakness and Swelling of Extremities. Neurological Not Present- Decreased Memory, Fainting, Headaches, Numbness, Seizures, Tingling, Tremor, Trouble walking and Weakness. Psychiatric Present- Anxiety. Not Present- Bipolar, Change in Sleep Pattern, Depression, Fearful and Frequent crying. Endocrine Present- Hair Changes and Hot flashes. Not Present- Cold Intolerance, Excessive Hunger, Heat Intolerance and New Diabetes. Hematology Not Present- Blood Thinners, Easy Bruising, Excessive bleeding, Gland problems, HIV and Persistent Infections.  Vitals Bary Castilla Bradford CMA; 03/25/2016 9:34 AM) 03/25/2016 9:33 AM Weight: 175.2 lb Height: 66in Body Surface Area: 1.89 m Body Mass Index: 28.28 kg/m  Temp.:  98.41F  Pulse: 75 (Regular)  BP: 118/78 (Sitting, Left Arm, Standard)       Physical Exam Rolm Bookbinder MD; 03/25/2016 1:22 PM) General Mental Status-Alert. Orientation-Oriented X3.  Chest and Lung Exam Chest and lung exam reveals -on auscultation, normal breath sounds, no adventitious sounds and normal vocal resonance.  Breast Nipples-No Discharge. Breast Lump-No Palpable Breast Mass.  Cardiovascular Cardiovascular examination reveals -normal heart sounds, regular rate and rhythm with no murmurs.  Lymphatic Head & Neck  General Head & Neck Lymphatics: Bilateral - Description - Normal. Axillary  General Axillary Region: Bilateral - Description - Normal. Note: no Rose[place] adenopathy     Assessment & Plan Rolm Bookbinder MD; 03/25/2016 1:25 PM) ABNORMAL MAMMOGRAM OF RIGHT BREAST (R92.8) Story: right breast excisional biopsy with seed guidance. we discussed options including observation vs surgery. we have elected to proceed with seed guided excisional biopy. procedure, risks, benefits were discussed.

## 2016-04-02 ENCOUNTER — Encounter (HOSPITAL_BASED_OUTPATIENT_CLINIC_OR_DEPARTMENT_OTHER): Payer: Self-pay | Admitting: General Surgery

## 2016-05-05 ENCOUNTER — Telehealth: Payer: BLUE CROSS/BLUE SHIELD | Admitting: Nurse Practitioner

## 2016-05-05 DIAGNOSIS — N3000 Acute cystitis without hematuria: Secondary | ICD-10-CM

## 2016-05-05 MED ORDER — CIPROFLOXACIN HCL 500 MG PO TABS
500.0000 mg | ORAL_TABLET | Freq: Two times a day (BID) | ORAL | 0 refills | Status: DC
Start: 2016-05-05 — End: 2016-07-09

## 2016-05-05 NOTE — Progress Notes (Signed)

## 2016-05-24 ENCOUNTER — Encounter: Payer: Self-pay | Admitting: Family Medicine

## 2016-05-27 ENCOUNTER — Encounter: Payer: Self-pay | Admitting: Family Medicine

## 2016-05-27 ENCOUNTER — Ambulatory Visit (INDEPENDENT_AMBULATORY_CARE_PROVIDER_SITE_OTHER): Payer: BLUE CROSS/BLUE SHIELD | Admitting: Family Medicine

## 2016-05-27 VITALS — BP 122/78 | HR 74 | Temp 98.2°F | Resp 16 | Ht 66.0 in | Wt 177.8 lb

## 2016-05-27 DIAGNOSIS — R600 Localized edema: Secondary | ICD-10-CM

## 2016-05-27 DIAGNOSIS — F329 Major depressive disorder, single episode, unspecified: Secondary | ICD-10-CM

## 2016-05-27 DIAGNOSIS — E785 Hyperlipidemia, unspecified: Secondary | ICD-10-CM | POA: Diagnosis not present

## 2016-05-27 DIAGNOSIS — N390 Urinary tract infection, site not specified: Secondary | ICD-10-CM

## 2016-05-27 DIAGNOSIS — Z0000 Encounter for general adult medical examination without abnormal findings: Secondary | ICD-10-CM

## 2016-05-27 DIAGNOSIS — F32A Depression, unspecified: Secondary | ICD-10-CM

## 2016-05-27 MED ORDER — TRIMETHOPRIM 100 MG PO TABS: 100.0000 mg | ORAL_TABLET | Freq: Every day | ORAL | 2 refills | Status: DC

## 2016-05-27 MED ORDER — TRIAMTERENE-HCTZ 37.5-25 MG PO TABS: 1.0000 | ORAL_TABLET | Freq: Every day | ORAL | 3 refills | Status: DC

## 2016-05-27 MED ORDER — CITALOPRAM HYDROBROMIDE 40 MG PO TABS
40.0000 mg | ORAL_TABLET | Freq: Every day | ORAL | 3 refills | Status: DC
Start: 2016-05-27 — End: 2017-05-31

## 2016-05-27 MED ORDER — VITAMIN D 1000 UNITS PO TABS: 2000.0000 [IU] | ORAL_TABLET | Freq: Every day | ORAL | 3 refills | Status: DC

## 2016-05-27 MED FILL — TRIMETHOPRIM 100 MG TABLET: 100 | 30 days supply | Qty: 30 | Fill #0

## 2016-05-27 MED FILL — VITAMIN D 1,000 UNITS TAB: 25 MCG | 50 days supply | Qty: 100 | Fill #0

## 2016-05-27 NOTE — Assessment & Plan Note (Signed)
Tolerating statin, encouraged heart healthy diet, avoid trans fats, minimize simple carbs and saturated fats. Increase exercise as tolerated 

## 2016-05-27 NOTE — Patient Instructions (Signed)

## 2016-05-27 NOTE — Assessment & Plan Note (Signed)
ghm utd Check labs  See avs  

## 2016-05-27 NOTE — Assessment & Plan Note (Signed)
Med per gyn

## 2016-05-27 NOTE — Progress Notes (Signed)
Pre visit review using our clinic review tool, if applicable. No additional management support is needed unless otherwise documented below in the visit note. 

## 2016-05-27 NOTE — Progress Notes (Signed)
Subjective:  I acted as a Education administrator for Dr. Royden Purl, LPN     Patient ID: [name], female    DOB: Aug 01, 1960, 56 y.o.   MRN: [id number]  Chief Complaint  Patient presents with  . Annual Exam    HPI  Patient is in today for annual physical, and follow up hyperlipidemia. Patient choose not to take Zocor, she would like to try to diet and exercise to control cholesterol. Patient report she had an left breast biopsy, done and a cystectomy. Patient report is not taking Tramadol, she is no longer having pain. Patient report she have no concerns at this time.  Patient Care Team: Ann Held, DO as PCP - General (Family Medicine) Ann Held, DO as Consulting Physician (Family Medicine) Morene Crocker, NP as Nurse Practitioner (Gynecology)   Past Medical History:  Diagnosis Date  . Anxiety   . Cervical cancer [facility])     Past Surgical History:  Procedure Laterality Date  . ABDOMINAL HYSTERECTOMY    . LAPAROSCOPIC RADICAL TOTAL HYSTERECTOMY W/ NODE BIOPSY    . RADIOACTIVE SEED GUIDED EXCISIONAL BREAST BIOPSY Right 04/01/2016   Procedure: RADIOACTIVE SEED GUIDED EXCISIONAL BREAST BIOPSY;  Surgeon: Rolm Bookbinder, MD;  Location: [facility];  Service: General;  Laterality: Right;    Family History  Problem Relation Age of Onset  . Prostate cancer Father     Social History   Social History  . Marital status: Divorced    Spouse name: N/A  . Number of children: N/A  . Years of education: N/A   Occupational History  . paralegal     paralegal --- foreclosure   Social History Main Topics  . Smoking status: Never Smoker  . Smokeless tobacco: Never Used  . Alcohol use 0.0 oz/week  . Drug use: No  . Sexual activity: Yes    Partners: Male   Other Topics Concern  . Not on file   Social History Narrative   Exercising-- walking     Outpatient Medications Prior to Visit  Medication Sig Dispense Refill  . BIOTIN PO Take by mouth.    Marland Kitchen  buPROPion (WELLBUTRIN XL) 300 MG 24 hr tablet Take 1 tablet (300 mg total) by mouth daily. 90 tablet 3  . Fish Oil OIL by Does not apply route.    . fluticasone (FLONASE) 50 MCG/ACT nasal spray Place 2 sprays into both nostrils daily. 16 g 6  . simvastatin (ZOCOR) 20 MG tablet Take 1 tablet (20 mg total) by mouth at bedtime. 90 tablet 3  . cholecalciferol (VITAMIN D) 1000 UNITS tablet Take 2,000 Units by mouth daily.     . citalopram (CELEXA) 40 MG tablet Take 1 tablet (40 mg total) by mouth daily. 90 tablet 3  . triamterene-hydrochlorothiazide (MAXZIDE-25) 37.5-25 MG tablet Take 1 tablet by mouth daily. 90 tablet 3  . ciprofloxacin (CIPRO) 500 MG tablet Take 1 tablet (500 mg total) by mouth 2 (two) times daily. (Patient not taking: Reported on 05/27/2016) 10 tablet 0  . traMADol (ULTRAM) 50 MG tablet Take 1 tablet (50 mg total) by mouth every 6 (six) hours as needed. (Patient not taking: Reported on 05/27/2016) 10 tablet 0   No facility-administered medications prior to visit.     Allergies  Allergen Reactions  . Vicodin [Hydrocodone-Acetaminophen] Rash    Review of Systems  Constitutional: Negative for fever.  HENT: Negative for congestion.   Eyes: Negative for blurred vision.  Respiratory: Negative for  cough.   Cardiovascular: Negative for chest pain and palpitations.  Gastrointestinal: Negative for vomiting.  Musculoskeletal: Negative for back pain.  Skin: Negative for rash.  Neurological: Negative for loss of consciousness and headaches.       Objective:    Physical Exam  Constitutional: She is oriented to person, place, and time. She appears well-developed and well-nourished. No distress.  HENT:  Head: Normocephalic and atraumatic.  Eyes: Conjunctivae are normal. Pupils are equal, round, and reactive to light.  Neck: Normal range of motion. No thyromegaly present.  Cardiovascular: Normal rate and regular rhythm.   Pulmonary/Chest: Effort normal and breath sounds normal.  She has no wheezes.  Abdominal: Soft. Bowel sounds are normal. There is no tenderness.  Musculoskeletal: Normal range of motion. She exhibits no edema or deformity.  Neurological: She is alert and oriented to person, place, and time.  Skin: Skin is warm and dry. She is not diaphoretic.  Psychiatric: She has a normal mood and affect.    BP 122/78 (BP Location: Left Arm, Patient Position: Sitting, Cuff Size: Normal)   Pulse 74   Temp 98.2 F (36.8 C) (Oral)   Resp 16   Ht 5\' 6"  (1.676 m)   Wt 177 lb 12.8 oz (80.6 kg)   SpO2 95%   BMI 28.70 kg/m  Wt Readings from Last 3 Encounters:  05/27/16 177 lb 12.8 oz (80.6 kg)  04/01/16 176 lb 12.8 oz (80.2 kg)  11/11/15 172 lb (78 kg)   BP Readings from Last 3 Encounters:  05/27/16 122/78  04/01/16 127/72  11/11/15 122/86     Immunization History  Administered Date(s) Administered  . Tdap 04/09/2013, 11/24/2014    Health Maintenance  Topic Date Due  . INFLUENZA VACCINE  05/27/2017 (Originally 09/30/2015)  . MAMMOGRAM  02/17/2017  . PAP SMEAR  02/18/2019  . COLONOSCOPY  07/14/2023  . TETANUS/TDAP  11/23/2024  . Hepatitis C Screening  Completed  . HIV Screening  Completed    Lab Results  Component Value Date   WBC 4.0 05/22/2015   HGB 13.7 05/22/2015   HCT 40.3 05/22/2015   PLT 214.0 05/22/2015   GLUCOSE 88 03/25/2016   CHOL 209 (H) 09/05/2015   TRIG 100.0 09/05/2015   HDL 53.40 09/05/2015   LDLCALC 135 (H) 09/05/2015   ALT 18 05/22/2015   AST 18 05/22/2015   NA 140 03/25/2016   K 4.2 03/25/2016   CL 102 03/25/2016   CREATININE 1.01 (H) 03/25/2016   BUN 16 03/25/2016   CO2 30 03/25/2016   TSH 1.97 05/22/2015   INR 0.98 11/24/2014   MICROALBUR 0.2 04/09/2013    Lab Results  Component Value Date   TSH 1.97 05/22/2015   Lab Results  Component Value Date   WBC 4.0 05/22/2015   HGB 13.7 05/22/2015   HCT 40.3 05/22/2015   MCV 89.4 05/22/2015   PLT 214.0 05/22/2015   Lab Results  Component Value Date   NA 140  03/25/2016   K 4.2 03/25/2016   CO2 30 03/25/2016   GLUCOSE 88 03/25/2016   BUN 16 03/25/2016   CREATININE 1.01 (H) 03/25/2016   BILITOT 0.5 05/22/2015   ALKPHOS 79 05/22/2015   AST 18 05/22/2015   ALT 18 05/22/2015   PROT 6.8 05/22/2015   ALBUMIN 4.1 05/22/2015   CALCIUM 9.6 03/25/2016   ANIONGAP 8 03/25/2016   GFR 65.08 05/22/2015   Lab Results  Component Value Date   CHOL 209 (H) 09/05/2015   Lab Results  Component Value Date   HDL 53.40 09/05/2015   Lab Results  Component Value Date   LDLCALC 135 (H) 09/05/2015   Lab Results  Component Value Date   TRIG 100.0 09/05/2015   Lab Results  Component Value Date   CHOLHDL 4 09/05/2015   No results found for: HGBA1C       Assessment & Plan:   Problem List Items Addressed This Visit      Unprioritized   Depression   Relevant Medications   citalopram (CELEXA) 40 MG tablet   Hyperlipidemia LDL goal <100    Tolerating statin, encouraged heart healthy diet, avoid trans fats, minimize simple carbs and saturated fats. Increase exercise as tolerated      Relevant Medications   triamterene-hydrochlorothiazide (MAXZIDE-25) 37.5-25 MG tablet   Preventative health care - Primary    ghm utd Check labs See avs       Relevant Orders   CBC   Comprehensive metabolic panel   Lipid panel    Other Visit Diagnoses    Edema extremities       Relevant Medications   triamterene-hydrochlorothiazide (MAXZIDE-25) 37.5-25 MG tablet   Recurrent UTI       Relevant Medications   mupirocin ointment (BACTROBAN) 2 %   trimethoprim (TRIMPEX) 100 MG tablet      I have changed Ms. Blowers's cholecalciferol. I am also having her start on trimethoprim. Additionally, I am having her maintain her fluticasone, BIOTIN PO, Fish Oil, buPROPion, simvastatin, traMADol, ciprofloxacin, mupirocin ointment, citalopram, and triamterene-hydrochlorothiazide.  Meds ordered this encounter  Medications  . mupirocin ointment (BACTROBAN) 2 %     Sig: Place 1 application into the nose 2 (two) times daily.  . cholecalciferol (VITAMIN D) 1000 units tablet    Sig: Take 2 tablets (2,000 Units total) by mouth daily.    Dispense:  90 tablet    Refill:  3  . citalopram (CELEXA) 40 MG tablet    Sig: Take 1 tablet (40 mg total) by mouth daily.    Dispense:  90 tablet    Refill:  3  . triamterene-hydrochlorothiazide (MAXZIDE-25) 37.5-25 MG tablet    Sig: Take 1 tablet by mouth daily.    Dispense:  90 tablet    Refill:  3  . trimethoprim (TRIMPEX) 100 MG tablet    Sig: Take 1 tablet (100 mg total) by mouth daily. prn    Dispense:  30 tablet    Refill:  2    CMA served as scribe during this visit. History, Physical and Plan performed by medical provider. Documentation and orders reviewed and attested to.  Ann Held, DO   Patient ID: Imani Fiebelkorn, female   DOB: January 02, 1961, 56 y.o.   MRN: [id number]

## 2016-05-31 ENCOUNTER — Encounter: Payer: Self-pay | Admitting: Family Medicine

## 2016-06-01 NOTE — Telephone Encounter (Signed)
Why was this sent to me? 

## 2016-07-02 ENCOUNTER — Other Ambulatory Visit: Payer: Self-pay | Admitting: Family Medicine

## 2016-07-02 DIAGNOSIS — F32A Depression, unspecified: Secondary | ICD-10-CM

## 2016-07-02 DIAGNOSIS — F329 Major depressive disorder, single episode, unspecified: Secondary | ICD-10-CM

## 2016-07-02 DIAGNOSIS — R600 Localized edema: Secondary | ICD-10-CM

## 2016-07-05 ENCOUNTER — Other Ambulatory Visit: Payer: Self-pay | Admitting: Family Medicine

## 2016-07-05 ENCOUNTER — Other Ambulatory Visit (INDEPENDENT_AMBULATORY_CARE_PROVIDER_SITE_OTHER): Payer: BLUE CROSS/BLUE SHIELD

## 2016-07-05 DIAGNOSIS — E785 Hyperlipidemia, unspecified: Secondary | ICD-10-CM

## 2016-07-05 DIAGNOSIS — Z0000 Encounter for general adult medical examination without abnormal findings: Secondary | ICD-10-CM

## 2016-07-05 LAB — COMPREHENSIVE METABOLIC PANEL
ALBUMIN: 4.1 g/dL (ref 3.5–5.2)
ALT: 13 U/L (ref 0–35)
AST: 17 U/L (ref 0–37)
Alkaline Phosphatase: 70 U/L (ref 39–117)
BILIRUBIN TOTAL: 0.5 mg/dL (ref 0.2–1.2)
BUN: 15 mg/dL (ref 6–23)
CALCIUM: 9.2 mg/dL (ref 8.4–10.5)
CO2: 31 mEq/L (ref 19–32)
Chloride: 103 mEq/L (ref 96–112)
Creatinine, Ser: 0.98 mg/dL (ref 0.40–1.20)
GFR: 62.53 mL/min (ref >60.00)
Glucose, Bld: 96 mg/dL (ref 70–99)
Potassium: 3.5 mEq/L (ref 3.5–5.1)
Sodium: 139 mEq/L (ref 135–145)
TOTAL PROTEIN: 6.5 g/dL (ref 6.0–8.3)

## 2016-07-05 LAB — CBC
HCT: 40.8 % (ref 36.0–46.0)
HEMOGLOBIN: 13.7 g/dL (ref 12.0–15.0)
MCHC: 33.6 g/dL (ref 30.0–36.0)
MCV: 92.2 fl (ref 78.0–100.0)
Platelets: 200 10*3/uL (ref 150.0–400.0)
RBC: 4.43 Mil/uL (ref 3.87–5.11)
RDW: 12.9 % (ref 11.5–15.5)
WBC: 3.6 10*3/uL — AB (ref 4.0–10.5)

## 2016-07-05 LAB — LIPID PANEL
Cholesterol: 209 mg/dL — ABNORMAL HIGH (ref 0–200)
HDL: 56 mg/dL (ref >39.00)
LDL Cholesterol: 135 mg/dL — ABNORMAL HIGH (ref 0–99)
NonHDL: 152.54
TRIGLYCERIDES: 88 mg/dL (ref 0.0–149.0)
Total CHOL/HDL Ratio: 4
VLDL: 17.6 mg/dL (ref 0.0–40.0)

## 2016-07-09 ENCOUNTER — Encounter: Payer: Self-pay | Admitting: Family Medicine

## 2016-07-09 ENCOUNTER — Ambulatory Visit (INDEPENDENT_AMBULATORY_CARE_PROVIDER_SITE_OTHER): Payer: BLUE CROSS/BLUE SHIELD | Admitting: Family Medicine

## 2016-07-09 ENCOUNTER — Telehealth: Payer: Self-pay | Admitting: Family Medicine

## 2016-07-09 VITALS — BP 110/70 | HR 63 | Temp 98.3°F | Ht 66.0 in | Wt 174.2 lb

## 2016-07-09 DIAGNOSIS — K649 Unspecified hemorrhoids: Secondary | ICD-10-CM | POA: Diagnosis not present

## 2016-07-09 MED ORDER — HYDROCORTISONE ACETATE 25 MG RE SUPP: 25.0000 mg | Freq: Two times a day (BID) | RECTAL | 0 refills | Status: DC

## 2016-07-09 MED FILL — PROCTO-MED HC 2.5% CREAM: 2.5 | 10 days supply | Qty: 30 | Fill #0

## 2016-07-09 NOTE — Telephone Encounter (Signed)
Pt called in because she says that PCP placed a referral for GI. She said that she spoke with Dr. Lorie Apley office and was advised that they couldn't see her until Tuesday. Pt would like to know if there is a different location that could see her sooner?  Pt says that it have to be a Psychologist, sport and exercise.   Please advise pt further.    CB: [phone]

## 2016-07-09 NOTE — Telephone Encounter (Signed)
Please advise, surgeon?

## 2016-07-09 NOTE — Patient Instructions (Signed)
How to Take a Sitz Bath A sitz bath is a warm water bath that is taken while you are sitting down. The water should only come up to your hips and should cover your buttocks. Your health care provider may recommend a sitz bath to help you:  Clean the lower part of your body, including your genital area.  With itching.  With pain.  With sore muscles or muscles that tighten or spasm. How to take a sitz bath Take 3-4 sitz baths per day or as told by your health care provider. 1. Partially fill a bathtub with warm water. You will only need the water to be deep enough to cover your hips and buttocks when you are sitting in it. 2. If your health care provider told you to put medicine in the water, follow the directions exactly. 3. Sit in the water and open the tub drain a little. 4. Turn on the warm water again to keep the tub at the correct level. Keep the water running constantly. 5. Soak in the water for 15-20 minutes or as told by your health care provider. 6. After the sitz bath, pat the affected area dry first. Do not rub it. 7. Be careful when you stand up after the sitz bath because you may feel dizzy. Contact a health care provider if:  Your symptoms get worse. Do not continue with sitz baths if your symptoms get worse.  You have new symptoms. Do not continue with sitz baths until you talk with your health care provider. This information is not intended to replace advice given to you by your health care provider. Make sure you discuss any questions you have with your health care provider. Document Released: 11/08/2003 Document Revised: 07/16/2015 Document Reviewed: 02/13/2014 Elsevier Interactive Patient Education  2017 [employer].

## 2016-07-09 NOTE — Progress Notes (Signed)
Patient ID: [name], female    DOB: [birth year]  Age: 56 y.o. MRN: [id number]    Subjective:  Subjective  HPI [name] presents for c/o hemorrhoids after gi virus last week  Review of Systems  Constitutional: Negative for activity change, appetite change, fatigue and unexpected weight change.  Respiratory: Negative for cough and shortness of breath.   Cardiovascular: Negative for chest pain and palpitations.  Gastrointestinal: Positive for diarrhea. Negative for abdominal distention, abdominal pain, blood in stool and constipation.  Psychiatric/Behavioral: Negative for behavioral problems and dysphoric mood. The patient is not nervous/anxious.     History Past Medical History:  Diagnosis Date  . Anxiety   . Cervical cancer [facility])     She has a past surgical history that includes Laparoscopic radical total hysterectomy w/ node biopsy; Abdominal hysterectomy; and Radioactive seed guided excisional breast biopsy (Right, 04/01/2016).   Her family history includes Prostate cancer in her father.She reports that she has never smoked. She has never used smokeless tobacco. She reports that she drinks alcohol. She reports that she does not use drugs.  Current Outpatient Prescriptions on File Prior to Visit  Medication Sig Dispense Refill  . BIOTIN PO Take by mouth.    Marland Kitchen buPROPion (WELLBUTRIN XL) 300 MG 24 hr tablet TAKE ONE TABLET BY MOUTH ONCE DAILY 90 tablet 3  . cholecalciferol (VITAMIN D) 1000 units tablet Take 2 tablets (2,000 Units total) by mouth daily. 90 tablet 3  . citalopram (CELEXA) 40 MG tablet Take 1 tablet (40 mg total) by mouth daily. 90 tablet 3  . citalopram (CELEXA) 40 MG tablet TAKE ONE TABLET BY MOUTH ONCE DAILY 90 tablet 3  . Fish Oil OIL by Does not apply route.    . fluticasone (FLONASE) 50 MCG/ACT nasal spray Place 2 sprays into both nostrils daily. 16 g 6  . simvastatin (ZOCOR) 20 MG tablet TAKE ONE TABLET BY MOUTH AT BEDTIME 90 tablet 3  .  triamterene-hydrochlorothiazide (MAXZIDE-25) 37.5-25 MG tablet Take 1 tablet by mouth daily. 90 tablet 3  . triamterene-hydrochlorothiazide (MAXZIDE-25) 37.5-25 MG tablet TAKE ONE TABLET BY MOUTH ONCE DAILY 90 tablet 3  . trimethoprim (TRIMPEX) 100 MG tablet Take 1 tablet (100 mg total) by mouth daily. prn 30 tablet 2   No current facility-administered medications on file prior to visit.      Objective:  Objective  Physical Exam  Constitutional: She is oriented to person, place, and time. She appears well-developed and well-nourished.  HENT:  Head: Normocephalic and atraumatic.  Eyes: Conjunctivae and EOM are normal.  Neck: Normal range of motion. Neck supple. No JVD present. Carotid bruit is not present. No thyromegaly present.  Cardiovascular: Normal rate, regular rhythm and normal heart sounds.   No murmur heard. Pulmonary/Chest: Effort normal and breath sounds normal. No respiratory distress. She has no wheezes. She has no rales. She exhibits no tenderness.  Genitourinary: Rectal exam shows external hemorrhoid. Rectal exam shows no internal hemorrhoid, no fissure, no mass, no tenderness, anal tone normal and guaiac negative stool.  Musculoskeletal: She exhibits no edema.  Neurological: She is alert and oriented to person, place, and time.  Psychiatric: She has a normal mood and affect. Her behavior is normal. Judgment and thought content normal.  Nursing note and vitals reviewed.  BP 110/70   Pulse 63   Temp 98.3 F (36.8 C) (Oral)   Ht 5\' 6"  (1.676 m)   Wt 174 lb 3.2 oz (79 kg)   SpO2 98%   BMI  28.12 kg/m  Wt Readings from Last 3 Encounters:  07/09/16 174 lb 3.2 oz (79 kg)  05/27/16 177 lb 12.8 oz (80.6 kg)  04/01/16 176 lb 12.8 oz (80.2 kg)     Lab Results  Component Value Date   WBC 3.6 (L) 07/05/2016   HGB 13.7 07/05/2016   HCT 40.8 07/05/2016   PLT 200.0 07/05/2016   GLUCOSE 96 07/05/2016   CHOL 209 (H) 07/05/2016   TRIG 88.0 07/05/2016   HDL 56.00 07/05/2016    LDLCALC 135 (H) 07/05/2016   ALT 13 07/05/2016   AST 17 07/05/2016   NA 139 07/05/2016   K 3.5 07/05/2016   CL 103 07/05/2016   CREATININE 0.98 07/05/2016   BUN 15 07/05/2016   CO2 31 07/05/2016   TSH 1.97 05/22/2015   INR 0.98 11/24/2014   MICROALBUR 0.2 04/09/2013    Mm Breast Surgical Specimen  Result Date: 04/01/2016 CLINICAL DATA:  Excisional biopsy of distortion involving the lower outer quadrant of the right breast with recent discordant benign core needle biopsy result. EXAM: SPECIMEN RADIOGRAPH OF THE RIGHT BREAST COMPARISON:  Previous exam(s). FINDINGS: Status post excision of the right breast. The radioactive seed and coil shaped biopsy marker clip are present, completely intact, and are marked for pathology. This was discussed by telephone with the operating room nurse at the time of interpretation on 04/01/2016 at 9:26 a.m. IMPRESSION: Specimen radiograph of the right breast. Electronically Signed   By: Evangeline Dakin M.D.   On: 04/01/2016 09:30     Assessment & Plan:  Plan  I have discontinued Ms. Meulemans's traMADol, ciprofloxacin, and mupirocin ointment. I am also having her start on hydrocortisone. Additionally, I am having her maintain her fluticasone, BIOTIN PO, Fish Oil, cholecalciferol, citalopram, triamterene-hydrochlorothiazide, trimethoprim, simvastatin, buPROPion, triamterene-hydrochlorothiazide, and citalopram.  Meds ordered this encounter  Medications  . hydrocortisone (ANUSOL-HC) 25 MG suppository    Sig: Place 1 suppository (25 mg total) rectally 2 (two) times daily.    Dispense:  12 suppository    Refill:  0    Problem List Items Addressed This Visit    None    Visit Diagnoses    Hemorrhoids, unspecified hemorrhoid type    -  Primary   Relevant Medications   hydrocortisone (ANUSOL-HC) 25 MG suppository   Other Relevant Orders   Ambulatory referral to Gastroenterology      Follow-up: Return if symptoms worsen or fail to improve.  Ann Held, DO

## 2016-07-12 ENCOUNTER — Other Ambulatory Visit: Payer: Self-pay | Admitting: Family Medicine

## 2016-07-12 ENCOUNTER — Encounter: Payer: Self-pay | Admitting: Family Medicine

## 2016-07-12 DIAGNOSIS — K649 Unspecified hemorrhoids: Secondary | ICD-10-CM

## 2016-07-12 NOTE — Telephone Encounter (Signed)
Refer to [place] [facility] for hemorrhoid-- to surgeon in note

## 2016-07-14 ENCOUNTER — Encounter: Payer: Self-pay | Admitting: General Surgery

## 2016-07-14 ENCOUNTER — Ambulatory Visit (INDEPENDENT_AMBULATORY_CARE_PROVIDER_SITE_OTHER): Payer: BLUE CROSS/BLUE SHIELD | Admitting: General Surgery

## 2016-07-14 VITALS — BP 130/72 | HR 62 | Resp 14 | Ht 66.0 in | Wt 174.0 lb

## 2016-07-14 DIAGNOSIS — K645 Perianal venous thrombosis: Secondary | ICD-10-CM | POA: Diagnosis not present

## 2016-07-14 NOTE — Patient Instructions (Addendum)
Continue use of Proctomed cream. Follow up in two weeks with anoscopy exam.

## 2016-07-14 NOTE — Progress Notes (Signed)
Patient ID: [name], female   DOB: 1960/04/24, 56 y.o.   MRN: [id number]  Chief Complaint  Patient presents with  . Other    hemorrhoids    HPI [name] is a 56 y.o. female here for assessment of hemorrhoids. She reports that she was seen by her PCP on Thursday 07/08/16. She had hemorrhoids before when her son was [age] years old. She reports the current flair up started on 07/07/16. She had a stomach bug and was having diarrhea with this. She reports some bleeding, pain, itching, and burning. She has been using Proctomed cream.  HPI  Past Medical History:  Diagnosis Date  . Anxiety   . Cervical cancer [facility])     Past Surgical History:  Procedure Laterality Date  . ABDOMINAL HYSTERECTOMY  2008  . COLONOSCOPY  2015  . LAPAROSCOPIC RADICAL TOTAL HYSTERECTOMY W/ NODE BIOPSY     still has ovaries  . RADIOACTIVE SEED GUIDED EXCISIONAL BREAST BIOPSY Right 04/01/2016   Procedure: RADIOACTIVE SEED GUIDED EXCISIONAL BREAST BIOPSY;  Surgeon: Rolm Bookbinder, MD;  Location: [place];  Service: General;  Laterality: Right;    Family History  Problem Relation Age of Onset  . Prostate cancer Father     Social History Social History  Substance Use Topics  . Smoking status: Never Smoker  . Smokeless tobacco: Never Used  . Alcohol use 0.0 oz/week    Allergies  Allergen Reactions  . Vicodin [Hydrocodone-Acetaminophen] Rash    Current Outpatient Prescriptions  Medication Sig Dispense Refill  . BIOTIN PO Take by mouth.    Marland Kitchen buPROPion (WELLBUTRIN XL) 300 MG 24 hr tablet TAKE ONE TABLET BY MOUTH ONCE DAILY 90 tablet 3  . cholecalciferol (VITAMIN D) 1000 units tablet Take 2 tablets (2,000 Units total) by mouth daily. 90 tablet 3  . citalopram (CELEXA) 40 MG tablet Take 1 tablet (40 mg total) by mouth daily. 90 tablet 3  . Fish Oil OIL by Does not apply route.    . fluticasone (FLONASE) 50 MCG/ACT nasal spray Place 2 sprays into both nostrils daily. 16 g 6  . simvastatin  (ZOCOR) 20 MG tablet TAKE ONE TABLET BY MOUTH AT BEDTIME 90 tablet 3  . triamterene-hydrochlorothiazide (MAXZIDE-25) 37.5-25 MG tablet Take 1 tablet by mouth daily. 90 tablet 3  . trimethoprim (TRIMPEX) 100 MG tablet Take 1 tablet (100 mg total) by mouth daily. prn 30 tablet 2   No current facility-administered medications for this visit.     Review of Systems Review of Systems  Constitutional: Negative.   Respiratory: Negative.   Cardiovascular: Negative.     Blood pressure 130/72, pulse 62, resp. rate 14, height 5\' 6"  (1.676 m), weight 174 lb (78.9 kg).  Physical Exam Physical Exam  Constitutional: She is oriented to person, place, and time. She appears well-developed and well-nourished.  Eyes: Conjunctivae are normal. No scleral icterus.  Neck: Neck supple.  Cardiovascular: Normal rate, regular rhythm and normal heart sounds.   Pulmonary/Chest: Effort normal and breath sounds normal.  Abdominal: Soft. Normal appearance and bowel sounds are normal.  Genitourinary: Rectal exam shows external hemorrhoid (1 cm thrombosed ).  Lymphadenopathy:    She has no cervical adenopathy.  Neurological: She is alert and oriented to person, place, and time.  Skin: Skin is warm and dry.  Psychiatric: She has a normal mood and affect.    Data Reviewed Previous colonoscopy  Assessment    Small thrombosed external hemorrhoid which appears to be resolving slowly. Current  exam shows no apparent prolapsing internal hemorrhoids or fissures or other abnormality.    Plan   continue with her current local preparations in follow-up in 2 weeks We'll perform an anoscopy at that time also.    HPI, Physical Exam, Assessment and Plan have been scribed under the direction and in the presence of Mckinley Jewel, MD  Concepcion Living, LPN  I have completed the exam and reviewed the above documentation for accuracy and completeness.  I agree with the above.  Haematologist has been used and any errors  in dictation or transcription are unintentional.  Rayven Rettig G. Jamal Collin, M.D., F.A.C.S.   Junie Panning G 07/16/2016, 9:31 AM

## 2016-07-29 ENCOUNTER — Ambulatory Visit (INDEPENDENT_AMBULATORY_CARE_PROVIDER_SITE_OTHER): Payer: BLUE CROSS/BLUE SHIELD | Admitting: General Surgery

## 2016-07-29 ENCOUNTER — Encounter: Payer: Self-pay | Admitting: General Surgery

## 2016-07-29 VITALS — BP 150/90 | HR 74 | Resp 12 | Ht 66.0 in | Wt 173.0 lb

## 2016-07-29 DIAGNOSIS — K645 Perianal venous thrombosis: Secondary | ICD-10-CM | POA: Diagnosis not present

## 2016-07-29 NOTE — Progress Notes (Signed)
Patient ID: [name], female   DOB: 28-Feb-1961, 56 y.o.   MRN: [id number]  Chief Complaint  Patient presents with  . Follow-up    HPI [name] is a 56 y.o. femalehere today for her two week follow up  Thrombosed hemorrhoid. Patient states the area is doing well, no more pain or bleeding.HPI    Past Medical History:  Diagnosis Date  . Anxiety   . Cervical cancer [facility])     Past Surgical History:  Procedure Laterality Date  . ABDOMINAL HYSTERECTOMY  2008  . COLONOSCOPY  2015  . LAPAROSCOPIC RADICAL TOTAL HYSTERECTOMY W/ NODE BIOPSY     still has ovaries  . RADIOACTIVE SEED GUIDED EXCISIONAL BREAST BIOPSY Right 04/01/2016   Procedure: RADIOACTIVE SEED GUIDED EXCISIONAL BREAST BIOPSY;  Surgeon: Rolm Bookbinder, MD;  Location: [facility];  Service: General;  Laterality: Right;    Family History  Problem Relation Age of Onset  . Prostate cancer Father     Social History Social History  Substance Use Topics  . Smoking status: Never Smoker  . Smokeless tobacco: Never Used  . Alcohol use 0.0 oz/week    Allergies  Allergen Reactions  . Vicodin [Hydrocodone-Acetaminophen] Rash    Current Outpatient Prescriptions  Medication Sig Dispense Refill  . BIOTIN PO Take by mouth.    Marland Kitchen buPROPion (WELLBUTRIN XL) 300 MG 24 hr tablet TAKE ONE TABLET BY MOUTH ONCE DAILY 90 tablet 3  . cholecalciferol (VITAMIN D) 1000 units tablet Take 2 tablets (2,000 Units total) by mouth daily. 90 tablet 3  . citalopram (CELEXA) 40 MG tablet Take 1 tablet (40 mg total) by mouth daily. 90 tablet 3  . Fish Oil OIL by Does not apply route.    . fluticasone (FLONASE) 50 MCG/ACT nasal spray Place 2 sprays into both nostrils daily. 16 g 6  . simvastatin (ZOCOR) 20 MG tablet TAKE ONE TABLET BY MOUTH AT BEDTIME 90 tablet 3  . triamterene-hydrochlorothiazide (MAXZIDE-25) 37.5-25 MG tablet Take 1 tablet by mouth daily. 90 tablet 3  . trimethoprim (TRIMPEX) 100 MG tablet Take 1 tablet (100 mg  total) by mouth daily. prn 30 tablet 2   No current facility-administered medications for this visit.     Review of Systems Review of Systems  Constitutional: Negative.   Respiratory: Negative.   Cardiovascular: Negative.     Blood pressure (!) 150/90, pulse 74, resp. rate 12, height 5\' 6"  (1.676 m), weight 173 lb (78.5 kg).  Physical Exam Physical Exam  Constitutional: She is oriented to person, place, and time. She appears well-developed and well-nourished.  Neurological: She is alert and oriented to person, place, and time.  Skin: Skin is warm and dry.   Rectal exam shows thrombosed hemorrhoid in posterior location has resolved. No other abnormalities.  Data Reviewed Prior notes reviewed   Assessment    Small thrombosed external hemorrhoid - resolved    Plan   Patient to return as needed. Advised on potential associated symptoms. To call as needed.    HPI, Physical Exam, Assessment and Plan have been scribed under the direction and in the presence of Mckinley Jewel, MD  Gaspar Cola, CMA     I have completed the exam and reviewed the above documentation for accuracy and completeness.  I agree with the above.  Haematologist has been used and any errors in dictation or transcription are unintentional.  Ashaad Gaertner G. Jamal Collin, M.D., F.A.C.S.  Junie Panning G 07/29/2016, 11:42 AM

## 2016-07-29 NOTE — Patient Instructions (Signed)
Return as needed

## 2016-12-03 ENCOUNTER — Encounter: Payer: Self-pay | Admitting: Family Medicine

## 2016-12-06 ENCOUNTER — Encounter: Payer: Self-pay | Admitting: Family Medicine

## 2016-12-06 NOTE — Telephone Encounter (Signed)
I have changed depression to anxiety and changed edema to low ext edema

## 2017-01-07 ENCOUNTER — Encounter: Payer: Self-pay | Admitting: Family Medicine

## 2017-01-10 ENCOUNTER — Other Ambulatory Visit: Payer: Self-pay | Admitting: Family Medicine

## 2017-01-10 DIAGNOSIS — R600 Localized edema: Secondary | ICD-10-CM

## 2017-01-10 MED ORDER — TRIAMTERENE-HCTZ 37.5-25 MG PO TABS: 1.0000 | ORAL_TABLET | Freq: Every day | ORAL | 3 refills | Status: DC

## 2017-01-10 NOTE — Telephone Encounter (Signed)
Sent in

## 2017-01-13 ENCOUNTER — Telehealth: Payer: BLUE CROSS/BLUE SHIELD | Admitting: Physician Assistant

## 2017-01-13 DIAGNOSIS — R300 Dysuria: Secondary | ICD-10-CM

## 2017-01-13 MED ORDER — CEPHALEXIN 500 MG PO CAPS: 500.0000 mg | ORAL_CAPSULE | Freq: Two times a day (BID) | ORAL | 0 refills | Status: AC

## 2017-01-13 NOTE — Progress Notes (Signed)

## 2017-01-19 MED ORDER — TRIMETHOPRIM 100 MG PO TABS: 100.0000 mg | ORAL_TABLET | Freq: Two times a day (BID) | ORAL | 2 refills | Status: DC

## 2017-02-09 ENCOUNTER — Encounter: Payer: Self-pay | Admitting: Family Medicine

## 2017-02-09 NOTE — Telephone Encounter (Signed)
I believe the wellbutrin xl is on the generic list at [facility] [employer]-- can we check that for her ?

## 2017-05-05 ENCOUNTER — Other Ambulatory Visit: Payer: Self-pay | Admitting: Family Medicine

## 2017-05-05 DIAGNOSIS — F329 Major depressive disorder, single episode, unspecified: Secondary | ICD-10-CM

## 2017-05-05 DIAGNOSIS — F32A Depression, unspecified: Secondary | ICD-10-CM

## 2017-05-09 ENCOUNTER — Telehealth: Payer: Self-pay

## 2017-05-09 NOTE — Telephone Encounter (Signed)
She Is taking both

## 2017-05-09 NOTE — Telephone Encounter (Signed)
Patient's pharmacy called today and is asking if patient is taking bupropion or Celexa. Both are on current medication list. I do not see in an ov notes where either have been discontinued. Please advise and I will call pharmacy back.

## 2017-05-11 NOTE — Telephone Encounter (Signed)
Noted  

## 2017-05-12 ENCOUNTER — Ambulatory Visit: Payer: BLUE CROSS/BLUE SHIELD | Admitting: Family Medicine

## 2017-05-12 NOTE — Telephone Encounter (Signed)
Tried calling pharmacy to let them know for the past couple days and no answer

## 2017-05-31 ENCOUNTER — Encounter: Payer: Self-pay | Admitting: Family Medicine

## 2017-05-31 ENCOUNTER — Ambulatory Visit (INDEPENDENT_AMBULATORY_CARE_PROVIDER_SITE_OTHER): Payer: PRIVATE HEALTH INSURANCE | Admitting: Family Medicine

## 2017-05-31 VITALS — BP 110/66 | HR 73 | Temp 97.9°F | Resp 16 | Ht 66.0 in | Wt 181.2 lb

## 2017-05-31 DIAGNOSIS — E785 Hyperlipidemia, unspecified: Secondary | ICD-10-CM | POA: Diagnosis not present

## 2017-05-31 DIAGNOSIS — E559 Vitamin D deficiency, unspecified: Secondary | ICD-10-CM | POA: Diagnosis not present

## 2017-05-31 DIAGNOSIS — R600 Localized edema: Secondary | ICD-10-CM | POA: Diagnosis not present

## 2017-05-31 DIAGNOSIS — Z0000 Encounter for general adult medical examination without abnormal findings: Secondary | ICD-10-CM

## 2017-05-31 DIAGNOSIS — F329 Major depressive disorder, single episode, unspecified: Secondary | ICD-10-CM

## 2017-05-31 DIAGNOSIS — N390 Urinary tract infection, site not specified: Secondary | ICD-10-CM

## 2017-05-31 DIAGNOSIS — F419 Anxiety disorder, unspecified: Secondary | ICD-10-CM

## 2017-05-31 DIAGNOSIS — F32A Depression, unspecified: Secondary | ICD-10-CM

## 2017-05-31 MED ORDER — BUPROPION HCL ER (XL) 300 MG PO TB24: 300.0000 mg | ORAL_TABLET | Freq: Every day | ORAL | 3 refills | Status: DC

## 2017-05-31 MED ORDER — VITAMIN D 1000 UNITS PO TABS: 2000.0000 [IU] | ORAL_TABLET | Freq: Every day | ORAL | 3 refills | Status: AC

## 2017-05-31 MED ORDER — TRIMETHOPRIM 100 MG PO TABS: 100.0000 mg | ORAL_TABLET | Freq: Every day | ORAL | 3 refills | Status: DC

## 2017-05-31 MED ORDER — VENLAFAXINE HCL ER 37.5 MG PO CP24: ORAL_CAPSULE | ORAL | 0 refills | Status: DC

## 2017-05-31 MED ORDER — SIMVASTATIN 20 MG PO TABS: 20.0000 mg | ORAL_TABLET | Freq: Every day | ORAL | 3 refills | Status: DC

## 2017-05-31 MED ORDER — CITALOPRAM HYDROBROMIDE 40 MG PO TABS: 40.0000 mg | ORAL_TABLET | Freq: Every day | ORAL | 3 refills | Status: DC

## 2017-05-31 MED ORDER — TRIAMTERENE-HCTZ 37.5-25 MG PO TABS: 1.0000 | ORAL_TABLET | Freq: Every day | ORAL | 3 refills | Status: DC

## 2017-05-31 MED ORDER — BUPROPION HCL ER (XL) 150 MG PO TB24: 150.0000 mg | ORAL_TABLET | Freq: Every day | ORAL | 0 refills | Status: DC

## 2017-05-31 NOTE — Patient Instructions (Addendum)
Decrease celexa to 20 mg daily for 1-2 weeks --- we may have to decrease to 10 mg after that  We will decrease wellbutrin xl 150 mg  Daily Once the celexa is to 10 mg you can start the effexor xr        Living With Anxiety After being diagnosed with an anxiety disorder, you may be relieved to know why you have felt or behaved a certain way. It is natural to also feel overwhelmed about the treatment ahead and what it will mean for your life. With care and support, you can manage this condition and recover from it. How to cope with anxiety Dealing with stress Stress is your body's reaction to life changes and events, both good and bad. Stress can last just a few hours or it can be ongoing. Stress can play a major role in anxiety, so it is important to learn both how to cope with stress and how to think about it differently. Talk with your health care provider or a counselor to learn more about stress reduction. He or she may suggest some stress reduction techniques, such as:  Music therapy. This can include creating or listening to music that you enjoy and that inspires you.  Mindfulness-based meditation. This involves being aware of your normal breaths, rather than trying to control your breathing. It can be done while sitting or walking.  Centering prayer. This is a kind of meditation that involves focusing on a word, phrase, or sacred image that is meaningful to you and that brings you peace.  Deep breathing. To do this, expand your stomach and inhale slowly through your nose. Hold your breath for 3-5 seconds. Then exhale slowly, allowing your stomach muscles to relax.  Self-talk. This is a skill where you identify thought patterns that lead to anxiety reactions and correct those thoughts.  Muscle relaxation. This involves tensing muscles then relaxing them.  Choose a stress reduction technique that fits your lifestyle and personality. Stress reduction techniques take time and practice.  Set aside 5-15 minutes a day to do them. Therapists can offer training in these techniques. The training may be covered by some insurance plans. Other things you can do to manage stress include:  Keeping a stress diary. This can help you learn what triggers your stress and ways to control your response.  Thinking about how you respond to certain situations. You may not be able to control everything, but you can control your reaction.  Making time for activities that help you relax, and not feeling guilty about spending your time in this way.  Therapy combined with coping and stress-reduction skills provides the best chance for successful treatment. Medicines Medicines can help ease symptoms. Medicines for anxiety include:  Anti-anxiety drugs.  Antidepressants.  Beta-blockers.  Medicines may be used as the main treatment for anxiety disorder, along with therapy, or if other treatments are not working. Medicines should be prescribed by a health care provider. Relationships Relationships can play a big part in helping you recover. Try to spend more time connecting with trusted friends and family members. Consider going to couples counseling, taking family education classes, or going to family therapy. Therapy can help you and others better understand the condition. How to recognize changes in your condition Everyone has a different response to treatment for anxiety. Recovery from anxiety happens when symptoms decrease and stop interfering with your daily activities at home or work. This may mean that you will start to:  Have better concentration  and focus.  Sleep better.  Be less irritable.  Have more energy.  Have improved memory.  It is important to recognize when your condition is getting worse. Contact your health care provider if your symptoms interfere with home or work and you do not feel like your condition is improving. Where to find help and support: You can get help and  support from these sources:  Self-help groups.  Online and [employer].  A trusted spiritual leader.  Couples counseling.  Family education classes.  Family therapy.  Follow these instructions at home:  Eat a healthy diet that includes plenty of vegetables, fruits, whole grains, low-fat dairy products, and lean protein. Do not eat a lot of foods that are high in solid fats, added sugars, or salt.  Exercise. Most adults should do the following: ? Exercise for at least 150 minutes each week. The exercise should increase your heart rate and make you sweat (moderate-intensity exercise). ? Strengthening exercises at least twice a week.  Cut down on caffeine, tobacco, alcohol, and other potentially harmful substances.  Get the right amount and quality of sleep. Most adults need 7-9 hours of sleep each night.  Make choices that simplify your life.  Take over-the-counter and prescription medicines only as told by your health care provider.  Avoid caffeine, alcohol, and certain over-the-counter cold medicines. These may make you feel worse. Ask your pharmacist which medicines to avoid.  Keep all follow-up visits as told by your health care provider. This is important. Questions to ask your health care provider  Would I benefit from therapy?  How often should I follow up with a health care provider?  How long do I need to take medicine?  Are there any long-term side effects of my medicine?  Are there any alternatives to taking medicine? Contact a health care provider if:  You have a hard time staying focused or finishing daily tasks.  You spend many hours a day feeling worried about everyday life.  You become exhausted by worry.  You start to have headaches, feel tense, or have nausea.  You urinate more than normal.  You have diarrhea. Get help right away if:  You have a racing heart and shortness of breath.  You have thoughts of hurting yourself or  others. If you ever feel like you may hurt yourself or others, or have thoughts about taking your own life, get help right away. You can go to your nearest emergency department or call:  Your local emergency services (911 in the [place]  A suicide crisis helpline, such as the [place] at 563-127-5963. This is open 24-hours a day.  Summary  Taking steps to deal with stress can help calm you.  Medicines cannot cure anxiety disorders, but they can help ease symptoms.  Family, friends, and partners can play a big part in helping you recover from an anxiety disorder. This information is not intended to replace advice given to you by your health care provider. Make sure you discuss any questions you have with your health care provider. Document Released: 02/10/2016 Document Revised: 02/10/2016 Document Reviewed: 02/10/2016 Elsevier Interactive Patient Education  2018 [place] Years, Female Preventive care refers to lifestyle choices and visits with your health care provider that can promote health and wellness. What does preventive care include?  A yearly physical exam. This is also called an annual well check.  Dental exams once or twice a year.  Routine  eye exams. Ask your health care provider how often you should have your eyes checked.  Personal lifestyle choices, including: ? Daily care of your teeth and gums. ? Regular physical activity. ? Eating a healthy diet. ? Avoiding tobacco and drug use. ? Limiting alcohol use. ? Practicing safe sex. ? Taking low-dose aspirin daily starting at age 44. ? Taking vitamin and mineral supplements as recommended by your health care provider. What happens during an annual well check? The services and screenings done by your health care provider during your annual well check will depend on your age, overall health, lifestyle risk factors, and family history of  disease. Counseling Your health care provider may ask you questions about your:  Alcohol use.  Tobacco use.  Drug use.  Emotional well-being.  Home and relationship well-being.  Sexual activity.  Eating habits.  Work and work Statistician.  Method of birth control.  Menstrual cycle.  Pregnancy history.  Screening You may have the following tests or measurements:  Height, weight, and BMI.  Blood pressure.  Lipid and cholesterol levels. These may be checked every 5 years, or more frequently if you are over 34 years old.  Skin check.  Lung cancer screening. You may have this screening every year starting at age 36 if you have a 30-pack-year history of smoking and currently smoke or have quit within the past 15 years.  Fecal occult blood test (FOBT) of the stool. You may have this test every year starting at age 62.  Flexible sigmoidoscopy or colonoscopy. You may have a sigmoidoscopy every 5 years or a colonoscopy every 10 years starting at age 24.  Hepatitis C blood test.  Hepatitis B blood test.  Sexually transmitted disease (STD) testing.  Diabetes screening. This is done by checking your blood sugar (glucose) after you have not eaten for a while (fasting). You may have this done every 1-3 years.  Mammogram. This may be done every 1-2 years. Talk to your health care provider about when you should start having regular mammograms. This may depend on whether you have a family history of breast cancer.  BRCA-related cancer screening. This may be done if you have a family history of breast, ovarian, tubal, or peritoneal cancers.  Pelvic exam and Pap test. This may be done every 3 years starting at age 24. Starting at age 50, this may be done every 5 years if you have a Pap test in combination with an HPV test.  Bone density scan. This is done to screen for osteoporosis. You may have this scan if you are at high risk for osteoporosis.  Discuss your test results,  treatment options, and if necessary, the need for more tests with your health care provider. Vaccines Your health care provider may recommend certain vaccines, such as:  Influenza vaccine. This is recommended every year.  Tetanus, diphtheria, and acellular pertussis (Tdap, Td) vaccine. You may need a Td booster every 10 years.  Varicella vaccine. You may need this if you have not been vaccinated.  Zoster vaccine. You may need this after age 18.  Measles, mumps, and rubella (MMR) vaccine. You may need at least one dose of MMR if you were born in 1957 or later. You may also need a second dose.  Pneumococcal 13-valent conjugate (PCV13) vaccine. You may need this if you have certain conditions and were not previously vaccinated.  Pneumococcal polysaccharide (PPSV23) vaccine. You may need one or two doses if you smoke cigarettes or if you have certain  conditions.  Meningococcal vaccine. You may need this if you have certain conditions.  Hepatitis A vaccine. You may need this if you have certain conditions or if you travel or work in places where you may be exposed to hepatitis A.  Hepatitis B vaccine. You may need this if you have certain conditions or if you travel or work in places where you may be exposed to hepatitis B.  Haemophilus influenzae type b (Hib) vaccine. You may need this if you have certain conditions.  Talk to your health care provider about which screenings and vaccines you need and how often you need them. This information is not intended to replace advice given to you by your health care provider. Make sure you discuss any questions you have with your health care provider. Document Released: 03/14/2015 Document Revised: 11/05/2015 Document Reviewed: 12/17/2014 Elsevier Interactive Patient Education  [employer].

## 2017-05-31 NOTE — Progress Notes (Signed)
Subjective:     [name] is a 57 y.o. female and is here for a comprehensive physical exam. The patient reports no problems.  Social History   Socioeconomic History  . Marital status: Divorced    Spouse name: Not on file  . Number of children: Not on file  . Years of education: Not on file  . Highest education level: Not on file  Occupational History  . Occupation: paralegal    Comment: paralegal --- foreclosure  Social Needs  . Financial resource strain: Not on file  . Food insecurity:    Worry: Not on file    Inability: Not on file  . Transportation needs:    Medical: Not on file    Non-medical: Not on file  Tobacco Use  . Smoking status: Never Smoker  . Smokeless tobacco: Never Used  Substance and Sexual Activity  . Alcohol use: Yes    Alcohol/week: 0.0 oz  . Drug use: No  . Sexual activity: Yes    Partners: Male  Lifestyle  . Physical activity:    Days per week: Not on file    Minutes per session: Not on file  . Stress: Not on file  Relationships  . Social connections:    Talks on phone: Not on file    Gets together: Not on file    Attends religious service: Not on file    Active member of club or organization: Not on file    Attends meetings of clubs or organizations: Not on file    Relationship status: Not on file  . Intimate partner violence:    Fear of current or ex partner: Not on file    Emotionally abused: Not on file    Physically abused: Not on file    Forced sexual activity: Not on file  Other Topics Concern  . Not on file  Social History Narrative   Exercising-- walking    Health Maintenance  Topic Date Due  . MAMMOGRAM  02/17/2017  . INFLUENZA VACCINE  09/29/2017  . PAP SMEAR  02/18/2019  . COLONOSCOPY  07/14/2023  . TETANUS/TDAP  11/23/2024  . Hepatitis C Screening  Completed  . HIV Screening  Completed    The following portions of the patient's history were reviewed and updated as appropriate:  She  has a past medical history of  Anxiety and Cervical cancer ([place]). She does not have any pertinent problems on file. She  has a past surgical history that includes Laparoscopic radical total hysterectomy w/ node biopsy; Radioactive seed guided excisional breast biopsy (Right, 04/01/2016); Abdominal hysterectomy (2008); and Colonoscopy (2015). Her family history includes Prostate cancer in her father. She  reports that she has never smoked. She has never used smokeless tobacco. She reports that she drinks alcohol. She reports that she does not use drugs. She has a current medication list which includes the following prescription(s): biotin, cholecalciferol, fish oil, fluticasone, simvastatin, triamterene-hydrochlorothiazide, trimethoprim, bupropion, and venlafaxine xr. Current Outpatient Medications on File Prior to Visit  Medication Sig Dispense Refill  . BIOTIN PO Take by mouth.    . Fish Oil OIL by Does not apply route.    . fluticasone (FLONASE) 50 MCG/ACT nasal spray Place 2 sprays into both nostrils daily. 16 g 6   No current facility-administered medications on file prior to visit.    She is allergic to vicodin [hydrocodone-acetaminophen]..  Review of Systems Review of Systems  Constitutional: Negative for activity change, appetite change and fatigue.  HENT: Negative  for hearing loss, congestion, tinnitus and ear discharge.  dentist q67m Eyes: Negative for visual disturbance (see optho q1y -- vision corrected to 20/20 with glasses).  Respiratory: Negative for cough, chest tightness and shortness of breath.   Cardiovascular: Negative for chest pain, palpitations and leg swelling.  Gastrointestinal: Negative for abdominal pain, diarrhea, constipation and abdominal distention.  Genitourinary: Negative for urgency, frequency, decreased urine volume and difficulty urinating.  Musculoskeletal: Negative for back pain, arthralgias and gait problem.  Skin: Negative for color change, pallor and rash.  Neurological: Negative for  dizziness, light-headedness, numbness and headaches.  Hematological: Negative for adenopathy. Does not bruise/bleed easily.  Psychiatric/Behavioral: Negative for suicidal ideas, confusion, sleep disturbance, self-injury, dysphoric mood, decreased concentration and agitation.       Objective:    BP 110/66 (BP Location: Left Arm, Cuff Size: Normal)   Pulse 73   Temp 97.9 F (36.6 C) (Oral)   Resp 16   Ht 5\' 6"  (1.676 m)   Wt 181 lb 3.2 oz (82.2 kg)   SpO2 97%   BMI 29.25 kg/m  General appearance: alert, cooperative, appears stated age and no distress Head: Normocephalic, without obvious abnormality, atraumatic Eyes: conjunctivae/corneas clear. PERRL, EOM's intact. Fundi benign. Ears: normal TM's and external ear canals both ears Nose: Nares normal. Septum midline. Mucosa normal. No drainage or sinus tenderness. Throat: lips, mucosa, and tongue normal; teeth and gums normal Neck: no adenopathy, no carotid bruit, no JVD, supple, symmetrical, trachea midline and thyroid not enlarged, symmetric, no tenderness/mass/nodules Back: symmetric, no curvature. ROM normal. No CVA tenderness. Lungs: clear to auscultation bilaterally Breasts: gyn Heart: regular rate and rhythm, S1, S2 normal, no murmur, click, rub or gallop Abdomen: soft, non-tender; bowel sounds normal; no masses,  no organomegaly Pelvic: deferred --gyn Extremities: extremities normal, atraumatic, no cyanosis or edema Pulses: 2+ and symmetric Skin: Skin color, texture, turgor normal. No rashes or lesions Lymph nodes: Cervical, supraclavicular, and axillary nodes normal. Neurologic: Alert and oriented X 3, normal strength and tone. Normal symmetric reflexes. Normal coordination and gait    Assessment:    Healthy female exam.      Plan:    ghm utd Check labs  See After Visit Summary for Counseling Recommendations     1. Preventative health care See above - CBC with Differential/Platelet; Future - Comprehensive  metabolic panel; Future - Lipid panel; Future - TSH; Future - TSH - Lipid panel - Comprehensive metabolic panel - CBC with Differential/Platelet  2. Depression Decrease dose and stop after 1 week  - buPROPion (WELLBUTRIN XL) 150 MG 24 hr tablet; Take 1 tablet (150 mg total) by mouth daily.  Dispense: 30 tablet; Refill: 0  3. Depression, unspecified depression type   4. Edema extremities  - triamterene-hydrochlorothiazide (MAXZIDE-25) 37.5-25 MG tablet; Take 1 tablet by mouth daily.  Dispense: 90 tablet; Refill: 3  5. Hyperlipidemia LDL goal <100 Encouraged heart healthy diet, increase exercise, avoid trans fats, consider a krill oil cap daily - simvastatin (ZOCOR) 20 MG tablet; Take 1 tablet (20 mg total) by mouth at bedtime.  Dispense: 90 tablet; Refill: 3  6. Vitamin D deficiency   - cholecalciferol (VITAMIN D) 1000 units tablet; Take 2 tablets (2,000 Units total) by mouth daily.  Dispense: 90 tablet; Refill: 3 - Vitamin D (25 hydroxy)  7. Recurrent UTI   - trimethoprim (TRIMPEX) 100 MG tablet; Take 1 tablet (100 mg total) by mouth daily.  Dispense: 90 tablet; Refill: 3  8. Anxiety and depression Anxiety has started --  wean off celexa and wellbutrin and start effexor  - venlafaxine XR (EFFEXOR XR) 37.5 MG 24 hr capsule; 1 po qd x 1 week then 2 po qd  Dispense: 60 capsule; Refill: 0

## 2017-06-01 LAB — CBC WITH DIFFERENTIAL/PLATELET
BASOS PCT: 0.5 % (ref 0.0–3.0)
Basophils Absolute: 0 10*3/uL (ref 0.0–0.1)
EOS ABS: 0 10*3/uL (ref 0.0–0.7)
EOS PCT: 0.1 % (ref 0.0–5.0)
HEMATOCRIT: 41.8 % (ref 36.0–46.0)
Hemoglobin: 14 g/dL (ref 12.0–15.0)
Lymphocytes Relative: 12.8 % (ref 12.0–46.0)
Lymphs Abs: 0.9 10*3/uL (ref 0.7–4.0)
MCHC: 33.5 g/dL (ref 30.0–36.0)
MCV: 94.1 fl (ref 78.0–100.0)
MONO ABS: 0.2 10*3/uL (ref 0.1–1.0)
Monocytes Relative: 3.3 % (ref 3.0–12.0)
Neutro Abs: 6 10*3/uL (ref 1.4–7.7)
Neutrophils Relative %: 83.3 % — ABNORMAL HIGH (ref 43.0–77.0)
Platelets: 244 10*3/uL (ref 150.0–400.0)
RBC: 4.44 Mil/uL (ref 3.87–5.11)
RDW: 13.1 % (ref 11.5–15.5)
WBC: 7.2 10*3/uL (ref 4.0–10.5)

## 2017-06-01 LAB — COMPREHENSIVE METABOLIC PANEL
ALT: 19 U/L (ref 0–35)
AST: 18 U/L (ref 0–37)
Albumin: 4.3 g/dL (ref 3.5–5.2)
Alkaline Phosphatase: 80 U/L (ref 39–117)
BUN: 28 mg/dL — AB (ref 6–23)
CHLORIDE: 100 meq/L (ref 96–112)
CO2: 32 meq/L (ref 19–32)
CREATININE: 0.9 mg/dL (ref 0.40–1.20)
Calcium: 10.1 mg/dL (ref 8.4–10.5)
GFR: 68.76 mL/min (ref >60.00)
Glucose, Bld: 105 mg/dL — ABNORMAL HIGH (ref 70–99)
Potassium: 5.4 mEq/L — ABNORMAL HIGH (ref 3.5–5.1)
SODIUM: 140 meq/L (ref 135–145)
Total Bilirubin: 0.4 mg/dL (ref 0.2–1.2)
Total Protein: 7.3 g/dL (ref 6.0–8.3)

## 2017-06-01 LAB — LIPID PANEL
CHOL/HDL RATIO: 3
Cholesterol: 179 mg/dL (ref 0–200)
HDL: 64.4 mg/dL (ref >39.00)
LDL Cholesterol: 102 mg/dL — ABNORMAL HIGH (ref 0–99)
NonHDL: 115.03
Triglycerides: 64 mg/dL (ref 0.0–149.0)
VLDL: 12.8 mg/dL (ref 0.0–40.0)

## 2017-06-01 LAB — VITAMIN D 25 HYDROXY (VIT D DEFICIENCY, FRACTURES): VITD: 38.48 ng/mL (ref 30.00–100.00)

## 2017-06-01 LAB — TSH: TSH: 1.64 u[IU]/mL (ref 0.35–4.50)

## 2017-07-04 ENCOUNTER — Encounter: Payer: Self-pay | Admitting: Medical

## 2017-07-04 ENCOUNTER — Ambulatory Visit: Payer: PRIVATE HEALTH INSURANCE | Admitting: Medical

## 2017-07-04 VITALS — BP 134/78 | HR 80 | Temp 98.1°F | Resp 16 | Ht 65.5 in | Wt 183.2 lb

## 2017-07-04 DIAGNOSIS — T7840XA Allergy, unspecified, initial encounter: Secondary | ICD-10-CM | POA: Diagnosis not present

## 2017-07-04 DIAGNOSIS — R21 Rash and other nonspecific skin eruption: Secondary | ICD-10-CM

## 2017-07-04 MED ORDER — HYDROXYZINE HCL 25 MG PO TABS: 25.0000 mg | ORAL_TABLET | Freq: Three times a day (TID) | ORAL | 0 refills | Status: DC | PRN

## 2017-07-04 MED ORDER — PREDNISONE 10 MG PO TABS: ORAL_TABLET | ORAL | 0 refills | Status: DC

## 2017-07-04 MED ORDER — METHYLPREDNISOLONE ACETATE 40 MG/ML IJ SUSP
40.0000 mg | Freq: Once | INTRAMUSCULAR | Status: AC
Start: 2017-07-04 — End: 2017-07-04
  Administered 2017-07-04: 40 mg via INTRAMUSCULAR

## 2017-07-04 MED FILL — predniSONE 10 MG TABS: 10 | 5 days supply | Qty: 15 | Fill #0

## 2017-07-04 MED FILL — hydrOXYzine HCL 25 MG TABS: 25 | 7 days supply | Qty: 21 | Fill #0

## 2017-07-04 NOTE — Progress Notes (Signed)
Subjective:    Patient ID: [name], female    DOB: 1960-12-06, 57 y.o.   MRN: [id number]  HPI  Pt in for evaluation. She got rash on rt arm. She was pulling up weeds in her yard. Contact with poison ivy. Has been itching since Saturday. Rash on her rt forearm mostly. Some now on left arm. Last time had poison ivy reaction got rash on her face. She wants to prevent severe reaction.  Pt not diabetic.     Review of Systems  Constitutional: Negative for chills, fatigue and fever.  Respiratory: Negative for cough, chest tightness, shortness of breath and wheezing.   Cardiovascular: Negative for chest pain and palpitations.  Musculoskeletal: Negative for back pain.  Skin: Positive for rash.       See HPI  Neurological: Negative for dizziness, seizures, speech difficulty, weakness and light-headedness.  Hematological: Negative for adenopathy. Does not bruise/bleed easily.  Psychiatric/Behavioral: Negative for behavioral problems, confusion and decreased concentration.    Past Medical History:  Diagnosis Date  . Anxiety   . Cervical cancer [facility])      Social History   Socioeconomic History  . Marital status: Divorced    Spouse name: Not on file  . Number of children: Not on file  . Years of education: Not on file  . Highest education level: Not on file  Occupational History  . Occupation: paralegal    Comment: paralegal --- foreclosure  Social Needs  . Financial resource strain: Not on file  . Food insecurity:    Worry: Not on file    Inability: Not on file  . Transportation needs:    Medical: Not on file    Non-medical: Not on file  Tobacco Use  . Smoking status: Never Smoker  . Smokeless tobacco: Never Used  Substance and Sexual Activity  . Alcohol use: Yes    Alcohol/week: 0.0 oz  . Drug use: No  . Sexual activity: Yes    Partners: Male  Lifestyle  . Physical activity:    Days per week: Not on file    Minutes per session: Not on file  . Stress: Not on  file  Relationships  . Social connections:    Talks on phone: Not on file    Gets together: Not on file    Attends religious service: Not on file    Active member of club or organization: Not on file    Attends meetings of clubs or organizations: Not on file    Relationship status: Not on file  . Intimate partner violence:    Fear of current or ex partner: Not on file    Emotionally abused: Not on file    Physically abused: Not on file    Forced sexual activity: Not on file  Other Topics Concern  . Not on file  Social History Narrative   Exercising-- walking     Past Surgical History:  Procedure Laterality Date  . ABDOMINAL HYSTERECTOMY  2008  . COLONOSCOPY  2015  . LAPAROSCOPIC RADICAL TOTAL HYSTERECTOMY W/ NODE BIOPSY     still has ovaries  . RADIOACTIVE SEED GUIDED EXCISIONAL BREAST BIOPSY Right 04/01/2016   Procedure: RADIOACTIVE SEED GUIDED EXCISIONAL BREAST BIOPSY;  Surgeon: Rolm Bookbinder, MD;  Location: [place];  Service: General;  Laterality: Right;    Family History  Problem Relation Age of Onset  . Prostate cancer Father     Allergies  Allergen Reactions  . Vicodin [Hydrocodone-Acetaminophen] Rash  Current Outpatient Medications on File Prior to Visit  Medication Sig Dispense Refill  . BIOTIN PO Take by mouth.    Marland Kitchen buPROPion (WELLBUTRIN XL) 150 MG 24 hr tablet Take 1 tablet (150 mg total) by mouth daily. 30 tablet 0  . cholecalciferol (VITAMIN D) 1000 units tablet Take 2 tablets (2,000 Units total) by mouth daily. 90 tablet 3  . Fish Oil OIL by Does not apply route.    . fluticasone (FLONASE) 50 MCG/ACT nasal spray Place 2 sprays into both nostrils daily. 16 g 6  . simvastatin (ZOCOR) 20 MG tablet Take 1 tablet (20 mg total) by mouth at bedtime. 90 tablet 3  . triamterene-hydrochlorothiazide (MAXZIDE-25) 37.5-25 MG tablet Take 1 tablet by mouth daily. 90 tablet 3  . trimethoprim (TRIMPEX) 100 MG tablet Take 1 tablet (100 mg total) by  mouth daily. 90 tablet 3  . venlafaxine XR (EFFEXOR XR) 37.5 MG 24 hr capsule 1 po qd x 1 week then 2 po qd 60 capsule 0   No current facility-administered medications on file prior to visit.     BP 134/78 (BP Location: Left Arm, Patient Position: Sitting, Cuff Size: Small)   Pulse 80   Temp 98.1 F (36.7 C) (Oral)   Resp 16   Ht 5' 5.5" (1.664 m)   Wt 183 lb 3.2 oz (83.1 kg)   SpO2 99%   BMI 30.02 kg/m       Objective:   Physical Exam   General- No acute distress. Pleasant patient. Neck- Full range of motion, no jvd Lungs- Clear, even and unlabored. Heart- regular rate and rhythm. Neurologic- CNII- XII grossly intact.  Derm-patient has linear papular rash of medial aspect of her right forearm.  3 small scattered papules left midforearm.     Assessment & Plan:  You do appear to have allergic reaction to recent poison ivy exposure.  She gave you Depo-Medrol 40 mg injection today in the office and prescribe you 5-day taper dose of prednisone.  Also made hydroxyzine available she can take 1 tablet every 8 hours as needed itching.  Please be aware of sedation side effects of hydroxyzine.  Reaction should gradually improve over the next 5 to 7 days.  Please avoid re-exposure.  If rash worsens or changes please let us know as well.  Follow-up 7 to 10 days or as needed.  Mackie Pai, PA-C

## 2017-07-04 NOTE — Patient Instructions (Signed)
You do appear to have allergic reaction to recent poison ivy exposure.  She gave you Depo-Medrol 40 mg injection today in the office and prescribe you 5-day taper dose of prednisone.  Also made hydroxyzine available she can take 1 tablet every 8 hours as needed itching.  Please be aware of sedation side effects of hydroxyzine.  Reaction should gradually improve over the next 5 to 7 days.  Please avoid re-exposure.  If rash worsens or changes please let us know as well.  Follow-up 7 to 10 days or as needed.

## 2017-08-11 ENCOUNTER — Encounter: Payer: Self-pay | Admitting: Family Medicine

## 2017-08-11 NOTE — Telephone Encounter (Signed)
Last dose of Celexa she was on it was 40mg .  Do you want to just send that in?

## 2017-08-16 ENCOUNTER — Other Ambulatory Visit: Payer: Self-pay | Admitting: Family Medicine

## 2017-08-16 DIAGNOSIS — F32A Depression, unspecified: Secondary | ICD-10-CM

## 2017-08-16 DIAGNOSIS — F329 Major depressive disorder, single episode, unspecified: Secondary | ICD-10-CM

## 2018-01-03 ENCOUNTER — Telehealth: Payer: PRIVATE HEALTH INSURANCE | Admitting: Physician Assistant

## 2018-01-03 DIAGNOSIS — N3000 Acute cystitis without hematuria: Secondary | ICD-10-CM

## 2018-01-03 MED ORDER — NITROFURANTOIN MONOHYD MACRO 100 MG PO CAPS: 100.0000 mg | ORAL_CAPSULE | Freq: Two times a day (BID) | ORAL | 0 refills | Status: AC

## 2018-01-03 NOTE — Progress Notes (Signed)

## 2018-02-01 ENCOUNTER — Encounter: Payer: Self-pay | Admitting: Family Medicine

## 2018-02-01 ENCOUNTER — Ambulatory Visit: Payer: PRIVATE HEALTH INSURANCE | Admitting: Family Medicine

## 2018-02-01 DIAGNOSIS — J069 Acute upper respiratory infection, unspecified: Secondary | ICD-10-CM

## 2018-02-01 NOTE — Assessment & Plan Note (Signed)
  Symptomatic therapy suggested: push fluids, rest, gargle warm salt water, use vaporizer or mist prn and return office visit prn if symptoms persist or worsen. Lack of antibiotic effectiveness discussed with her. Call or return to clinic prn if these symptoms worsen or fail to improve as anticipated.

## 2018-02-01 NOTE — Progress Notes (Signed)
[name] - 57 y.o. female MRN [id number]  Date of birth: June 26, 1960  Subjective Chief Complaint  Patient presents with  . Cough    started sat night   . Generalized Body Aches    HPI SUBJECTIVE:  [name] is a 57 y.o. female who complains of sore throat, dry cough and myalgias for 4 days. She denies a history of chest pain, chills, fatigue, fevers, nausea, vomiting and wheezing and denies a history of asthma.  Has been using OTC mucinex without much improvement.    ROS:  A comprehensive ROS was completed and negative except as noted per HPI       Allergies  Allergen Reactions  . Vicodin [Hydrocodone-Acetaminophen] Rash    Past Medical History:  Diagnosis Date  . Anxiety   . Cervical cancer [facility])     Past Surgical History:  Procedure Laterality Date  . ABDOMINAL HYSTERECTOMY  2008  . COLONOSCOPY  2015  . LAPAROSCOPIC RADICAL TOTAL HYSTERECTOMY W/ NODE BIOPSY     still has ovaries  . RADIOACTIVE SEED GUIDED EXCISIONAL BREAST BIOPSY Right 04/01/2016   Procedure: RADIOACTIVE SEED GUIDED EXCISIONAL BREAST BIOPSY;  Surgeon: Rolm Bookbinder, MD;  Location: [place];  Service: General;  Laterality: Right;    Social History   Socioeconomic History  . Marital status: Divorced    Spouse name: Not on file  . Number of children: Not on file  . Years of education: Not on file  . Highest education level: Not on file  Occupational History  . Occupation: paralegal    Comment: paralegal --- foreclosure  Social Needs  . Financial resource strain: Not on file  . Food insecurity:    Worry: Not on file    Inability: Not on file  . Transportation needs:    Medical: Not on file    Non-medical: Not on file  Tobacco Use  . Smoking status: Never Smoker  . Smokeless tobacco: Never Used  Substance and Sexual Activity  . Alcohol use: Yes    Alcohol/week: 0.0 standard drinks  . Drug use: No  . Sexual activity: Yes    Partners: Male  Lifestyle  . Physical  activity:    Days per week: Not on file    Minutes per session: Not on file  . Stress: Not on file  Relationships  . Social connections:    Talks on phone: Not on file    Gets together: Not on file    Attends religious service: Not on file    Active member of club or organization: Not on file    Attends meetings of clubs or organizations: Not on file    Relationship status: Not on file  Other Topics Concern  . Not on file  Social History Narrative   Exercising-- walking     Family History  Problem Relation Age of Onset  . Prostate cancer Father     Health Maintenance  Topic Date Due  . MAMMOGRAM  02/17/2017  . INFLUENZA VACCINE  09/29/2017  . PAP SMEAR  02/18/2019  . COLONOSCOPY  07/14/2023  . TETANUS/TDAP  11/23/2024  . Hepatitis C Screening  Completed  . HIV Screening  Completed    ----------------------------------------------------------------------------------------------------------------------------------------------------------------------------------------------------------------- Physical Exam BP 110/70   Pulse 82   Temp 98.4 F (36.9 C) (Oral)   Wt 182 lb (82.6 kg)   SpO2 97%   BMI 29.83 kg/m   Physical Exam  Constitutional: She is oriented to person, place, and time. She appears well-nourished.  No distress.  HENT:  Head: Normocephalic and atraumatic.  Mouth/Throat: Oropharynx is clear and moist.  Mild posterior OP erythema without exudate.   Normal TM bilaterally.   Eyes: No scleral icterus.  Neck: Neck supple. No thyromegaly present.  Cardiovascular: Normal rate, regular rhythm and normal heart sounds.  Pulmonary/Chest: Effort normal and breath sounds normal.  Lymphadenopathy:    She has no cervical adenopathy.  Neurological: She is alert and oriented to person, place, and time.  Skin: Skin is warm and dry.  Psychiatric: She has a normal mood and affect. Her behavior is normal. Thought content normal.     ------------------------------------------------------------------------------------------------------------------------------------------------------------------------------------------------------------------- Assessment and Plan  URI (upper respiratory infection)  Symptomatic therapy suggested: push fluids, rest, gargle warm salt water, use vaporizer or mist prn and return office visit prn if symptoms persist or worsen. Lack of antibiotic effectiveness discussed with her. Call or return to clinic prn if these symptoms worsen or fail to improve as anticipated.

## 2018-02-01 NOTE — Patient Instructions (Signed)

## 2018-02-02 ENCOUNTER — Other Ambulatory Visit: Payer: Self-pay | Admitting: Family Medicine

## 2018-02-02 ENCOUNTER — Telehealth: Payer: Self-pay | Admitting: Emergency Medicine

## 2018-02-02 MED ORDER — AZITHROMYCIN 250 MG PO TABS: ORAL_TABLET | ORAL | 0 refills | Status: DC

## 2018-02-02 NOTE — Telephone Encounter (Signed)
Patient is aware that medication has been sent to pharmacy and to f/u if patient is not feeling better after completing the medication.

## 2018-02-02 NOTE — Telephone Encounter (Signed)
Copied from [place] [id number]. Topic: General - Other >> Feb 02, 2018  7:15 AM Keene Breath wrote: Reason for CRM: Patient called to inform the doctor that she is feeling worse and coughing more and would like an antibiotic.  She stated that when she saw Dr. Zigmund Daniel on 01/02/18, he did not give her any medication and said to let it run its course for 3-4 days.  Patient feels it is worse.  Please advise.  CB# [phone]

## 2018-02-02 NOTE — Telephone Encounter (Signed)
Rx for z-pack sent in.  F/u if not improving with completion of this.

## 2018-06-02 ENCOUNTER — Encounter: Payer: Self-pay | Admitting: Family Medicine

## 2018-06-23 ENCOUNTER — Ambulatory Visit (INDEPENDENT_AMBULATORY_CARE_PROVIDER_SITE_OTHER): Payer: PRIVATE HEALTH INSURANCE | Admitting: Family Medicine

## 2018-06-23 ENCOUNTER — Other Ambulatory Visit: Payer: Self-pay

## 2018-06-23 DIAGNOSIS — E785 Hyperlipidemia, unspecified: Secondary | ICD-10-CM | POA: Diagnosis not present

## 2018-06-23 DIAGNOSIS — R600 Localized edema: Secondary | ICD-10-CM

## 2018-06-23 DIAGNOSIS — F329 Major depressive disorder, single episode, unspecified: Secondary | ICD-10-CM

## 2018-06-23 DIAGNOSIS — J029 Acute pharyngitis, unspecified: Secondary | ICD-10-CM

## 2018-06-23 DIAGNOSIS — F32A Depression, unspecified: Secondary | ICD-10-CM

## 2018-06-23 DIAGNOSIS — N390 Urinary tract infection, site not specified: Secondary | ICD-10-CM

## 2018-06-23 MED ORDER — TRIMETHOPRIM 100 MG PO TABS: 100.0000 mg | ORAL_TABLET | Freq: Every day | ORAL | 3 refills | Status: DC

## 2018-06-23 MED ORDER — CITALOPRAM HYDROBROMIDE 40 MG PO TABS: 40.0000 mg | ORAL_TABLET | Freq: Every day | ORAL | 3 refills | Status: DC

## 2018-06-23 MED ORDER — BUPROPION HCL ER (XL) 150 MG PO TB24: 150.0000 mg | ORAL_TABLET | Freq: Every day | ORAL | 0 refills | Status: DC

## 2018-06-23 MED ORDER — SIMVASTATIN 20 MG PO TABS: 20.0000 mg | ORAL_TABLET | Freq: Every day | ORAL | 3 refills | Status: DC

## 2018-06-23 MED ORDER — FLUTICASONE PROPIONATE 50 MCG/ACT NA SUSP: 2.0000 | Freq: Every day | NASAL | 6 refills | Status: DC

## 2018-06-23 MED ORDER — TRIAMTERENE-HCTZ 37.5-25 MG PO TABS: 1.0000 | ORAL_TABLET | Freq: Every day | ORAL | 3 refills | Status: DC

## 2018-06-23 NOTE — Progress Notes (Signed)
Virtual Visit via Video Note  I connected with [name] on 06/23/18 at  2:00 PM EDT by a video enabled telemedicine application and verified that I am speaking with the correct person using two identifiers.   I discussed the limitations of evaluation and management by telemedicine and the availability of in person appointments. The patient expressed understanding and agreed to proceed.  History of Present Illness:    Observations/Objective: 69 p , afebrile   Assessment and Plan:   Follow Up Instructions:    I discussed the assessment and treatment plan with the patient. The patient was provided an opportunity to ask questions and all were answered. The patient agreed with the plan and demonstrated an understanding of the instructions.   The patient was advised to call back or seek an in-person evaluation if the symptoms worsen or if the condition fails to improve as anticipated.  I provided 15 minutes of non-face-to-face time during this encounter.   Ann Held, DO

## 2018-07-12 ENCOUNTER — Other Ambulatory Visit: Payer: Self-pay | Admitting: Family Medicine

## 2018-07-12 DIAGNOSIS — F329 Major depressive disorder, single episode, unspecified: Secondary | ICD-10-CM

## 2018-07-12 DIAGNOSIS — F32A Depression, unspecified: Secondary | ICD-10-CM

## 2018-07-12 MED ORDER — BUPROPION HCL ER (XL) 150 MG PO TB24: 150.0000 mg | ORAL_TABLET | Freq: Every day | ORAL | 5 refills | Status: DC

## 2018-12-08 ENCOUNTER — Other Ambulatory Visit: Payer: Self-pay | Admitting: Family Medicine

## 2018-12-08 DIAGNOSIS — F32A Depression, unspecified: Secondary | ICD-10-CM

## 2018-12-08 DIAGNOSIS — F329 Major depressive disorder, single episode, unspecified: Secondary | ICD-10-CM

## 2018-12-23 ENCOUNTER — Ambulatory Visit
Admission: EM | Admit: 2018-12-23 | Discharge: 2018-12-23 | Disposition: A | Discharge status: Home / Self Care | Payer: PRIVATE HEALTH INSURANCE | Attending: Emergency Medicine | Admitting: Emergency Medicine

## 2018-12-23 ENCOUNTER — Other Ambulatory Visit: Payer: Self-pay

## 2018-12-23 ENCOUNTER — Encounter: Payer: Self-pay | Admitting: Emergency Medicine

## 2018-12-23 DIAGNOSIS — B9689 Other specified bacterial agents as the cause of diseases classified elsewhere: Secondary | ICD-10-CM

## 2018-12-23 DIAGNOSIS — N3001 Acute cystitis with hematuria: Secondary | ICD-10-CM

## 2018-12-23 LAB — POCT URINALYSIS DIP (MANUAL ENTRY)
Bilirubin, UA: NEGATIVE
Glucose, UA: NEGATIVE mg/dL
Ketones, POC UA: NEGATIVE mg/dL
Nitrite, UA: NEGATIVE
Protein Ur, POC: NEGATIVE mg/dL
Spec Grav, UA: 1.015 (ref 1.010–1.025)
Urobilinogen, UA: 0.2 E.U./dL
pH, UA: 6.5 (ref 5.0–8.0)

## 2018-12-23 MED ORDER — CEPHALEXIN 500 MG PO CAPS: 500.0000 mg | ORAL_CAPSULE | Freq: Two times a day (BID) | ORAL | 0 refills | Status: AC

## 2018-12-23 NOTE — ED Triage Notes (Signed)
Pt presents to [facility] for assessment of lower abdominal pain with urinary frequency and burning with urination since waking this morning.

## 2018-12-23 NOTE — ED Notes (Signed)
Patient able to ambulate independently  

## 2018-12-23 NOTE — ED Provider Notes (Signed)
[facility]    CSN: [id number] Arrival date & time: 12/23/18  1157      History   Chief Complaint Chief Complaint  Patient presents with  . Dysuria    HPI [name] is a 58 y.o. female with history of UTI presenting for lower abdominal cramping, urinary frequency, burning with urination since this morning.  Patient states this feels similar to previous UTIs.  Patient takes trimethoprim as needed after intercourse, has not missed any doses.  Chart review done by me at time of appointment showing last in office visit for acute cystitis September 2017: Given ciprofloxacin, urine culture did not grow anything.  Urine culture from 10/29/2014 showing E. coli.  Patient has not tried thing for her current symptoms.   Past Medical History:  Diagnosis Date  . Anxiety   . Cervical cancer [facility])     Patient Active Problem List   Diagnosis Date Noted  . URI (upper respiratory infection) 02/01/2018  . Hyperlipidemia LDL goal <100 05/27/2016  . Preventative health care 05/27/2016  . Hidradenitis axillaris 12/16/2014  . Anxiety 04/11/2014  . Lower extremity edema 04/11/2014    Past Surgical History:  Procedure Laterality Date  . ABDOMINAL HYSTERECTOMY  2008  . COLONOSCOPY  2015  . LAPAROSCOPIC RADICAL TOTAL HYSTERECTOMY W/ NODE BIOPSY     still has ovaries  . RADIOACTIVE SEED GUIDED EXCISIONAL BREAST BIOPSY Right 04/01/2016   Procedure: RADIOACTIVE SEED GUIDED EXCISIONAL BREAST BIOPSY;  Surgeon: Rolm Bookbinder, MD;  Location: [place];  Service: General;  Laterality: Right;    OB History    Gravida  1   Para  1   Term      Preterm      AB      Living  1     SAB      TAB      Ectopic      Multiple      Live Births           Obstetric Comments  Menstrual age: 6  Age 1st Pregnancy: 36         Home Medications    Prior to Admission medications   Medication Sig Start Date End Date Taking? Authorizing Provider  BIOTIN PO  Take by mouth.    [provider]  buPROPion (WELLBUTRIN XL) 150 MG 24 hr tablet Take 1 tablet (150 mg total) by mouth daily. 07/12/18   Ann Held, DO  cephALEXin (KEFLEX) 500 MG capsule Take 1 capsule (500 mg total) by mouth 2 (two) times daily for 5 days. 12/23/18 12/28/18  Hall-Potvin, [place], PA-C  cholecalciferol (VITAMIN D) 1000 units tablet Take 2 tablets (2,000 Units total) by mouth daily. 05/31/17   Ann Held, DO  Fish Oil OIL by Does not apply route.    [provider]  fluticasone (FLONASE) 50 MCG/ACT nasal spray Place 2 sprays into both nostrils daily. 06/23/18   Ann Held, DO  simvastatin (ZOCOR) 20 MG tablet Take 1 tablet (20 mg total) by mouth at bedtime. 06/23/18   Ann Held, DO  triamterene-hydrochlorothiazide (MAXZIDE-25) 37.5-25 MG tablet Take 1 tablet by mouth daily. 06/23/18   Ann Held, DO  trimethoprim (TRIMPEX) 100 MG tablet Take 1 tablet (100 mg total) by mouth daily. 06/23/18   Ann Held, DO  citalopram (CELEXA) 40 MG tablet Take 1 tablet by mouth once daily 12/08/18 12/23/18  Carollee Herter, Alferd Apa,  DO    Family History Family History  Problem Relation Age of Onset  . Prostate cancer Father     Social History Social History   Tobacco Use  . Smoking status: Never Smoker  . Smokeless tobacco: Never Used  Substance Use Topics  . Alcohol use: Yes    Alcohol/week: 0.0 standard drinks  . Drug use: No     Allergies   Vicodin [hydrocodone-acetaminophen]   Review of Systems Review of Systems  Constitutional: Negative for fatigue and fever.  Respiratory: Negative for cough and shortness of breath.   Cardiovascular: Negative for chest pain and palpitations.  Gastrointestinal: Positive for abdominal pain. Negative for constipation and diarrhea.  Genitourinary: Positive for dysuria, frequency and urgency. Negative for dyspareunia, flank pain, hematuria, pelvic pain, vaginal  bleeding, vaginal discharge and vaginal pain.     Physical Exam Triage Vital Signs ED Triage Vitals  Enc Vitals Group     BP 12/23/18 1204 133/78     Pulse Rate 12/23/18 1204 74     Resp 12/23/18 1204 16     Temp 12/23/18 1204 98 F (36.7 C)     Temp Source 12/23/18 1204 Oral     SpO2 12/23/18 1204 97 %     Weight --      Height --      Head Circumference --      Peak Flow --      Pain Score 12/23/18 1205 3     Pain Loc --      Pain Edu? --      Excl. in [place]? --    No data found.  Updated Vital Signs BP 133/78 (BP Location: Left Arm)   Pulse 74   Temp 98 F (36.7 C) (Oral)   Resp 16   SpO2 97%   Visual Acuity Right Eye Distance:   Left Eye Distance:   Bilateral Distance:    Right Eye Near:   Left Eye Near:    Bilateral Near:     Physical Exam Constitutional:      General: She is not in acute distress. HENT:     Head: Normocephalic and atraumatic.  Eyes:     General: No scleral icterus.    Pupils: Pupils are equal, round, and reactive to light.  Cardiovascular:     Rate and Rhythm: Normal rate.  Pulmonary:     Effort: Pulmonary effort is normal.  Abdominal:     General: Bowel sounds are normal.     Palpations: Abdomen is soft.     Tenderness: There is no right CVA tenderness, left CVA tenderness or guarding.     Comments: Mild suprapubic tenderness without rebound/guarding  Genitourinary:    Comments: Deferred Skin:    Coloration: Skin is not jaundiced or pale.  Neurological:     Mental Status: She is alert and oriented to person, place, and time.      UC Treatments / Results  Labs (all labs ordered are listed, but only abnormal results are displayed) Labs Reviewed  POCT URINALYSIS DIP (MANUAL ENTRY) - Abnormal; Notable for the following components:      Result Value   Clarity, UA cloudy (*)    Blood, UA trace-lysed (*)    Leukocytes, UA Large (3+) (*)    All other components within normal limits  URINE CULTURE    EKG   Radiology No  results found.  Procedures Procedures (including critical care time)  Medications Ordered in UC Medications - No data to  display  Initial Impression / Assessment and Plan / UC Course  I have reviewed the triage vital signs and the nursing notes.  Pertinent labs & imaging results that were available during my care of the patient were reviewed by me and considered in my medical decision making (see chart for details).     POCT urinalysis done in office, reviewed by me: Showing trace, lysed RBC, large leukocytes-culture pending.  Patient without flank pain, fever: We will treat with Keflex as outlined below.  Return precautions discussed, patient verbalized understanding and is agreeable to plan. Final Clinical Impressions(s) / UC Diagnoses   Final diagnoses:  Acute cystitis with hematuria     Discharge Instructions     Take antibiotic as prescribed with food. Return for worsening symptoms, back pain, fever, blood in urine.    ED Prescriptions    Medication Sig Dispense Auth. Provider   cephALEXin (KEFLEX) 500 MG capsule Take 1 capsule (500 mg total) by mouth 2 (two) times daily for 5 days. 10 capsule Hall-Potvin, [place], PA-C     PDMP not reviewed this encounter.   Hall-Potvin, [place], [place] 12/23/18 1234

## 2018-12-23 NOTE — Discharge Instructions (Addendum)
Take antibiotic as prescribed with food. Return for worsening symptoms, back pain, fever, blood in urine.

## 2018-12-26 LAB — URINE CULTURE

## 2018-12-28 ENCOUNTER — Other Ambulatory Visit: Payer: Self-pay

## 2018-12-29 ENCOUNTER — Encounter: Payer: Self-pay | Admitting: Family Medicine

## 2018-12-29 ENCOUNTER — Ambulatory Visit (INDEPENDENT_AMBULATORY_CARE_PROVIDER_SITE_OTHER): Payer: PRIVATE HEALTH INSURANCE | Admitting: Family Medicine

## 2018-12-29 VITALS — BP 122/80 | HR 71 | Temp 97.0°F | Resp 18 | Ht 65.6 in | Wt 182.2 lb

## 2018-12-29 DIAGNOSIS — R82998 Other abnormal findings in urine: Secondary | ICD-10-CM | POA: Diagnosis not present

## 2018-12-29 DIAGNOSIS — Z23 Encounter for immunization: Secondary | ICD-10-CM

## 2018-12-29 DIAGNOSIS — F32A Depression, unspecified: Secondary | ICD-10-CM

## 2018-12-29 DIAGNOSIS — R600 Localized edema: Secondary | ICD-10-CM

## 2018-12-29 DIAGNOSIS — Z87440 Personal history of urinary (tract) infections: Secondary | ICD-10-CM | POA: Diagnosis not present

## 2018-12-29 DIAGNOSIS — I10 Essential (primary) hypertension: Secondary | ICD-10-CM

## 2018-12-29 DIAGNOSIS — J029 Acute pharyngitis, unspecified: Secondary | ICD-10-CM

## 2018-12-29 DIAGNOSIS — E785 Hyperlipidemia, unspecified: Secondary | ICD-10-CM | POA: Diagnosis not present

## 2018-12-29 DIAGNOSIS — F329 Major depressive disorder, single episode, unspecified: Secondary | ICD-10-CM | POA: Diagnosis not present

## 2018-12-29 DIAGNOSIS — Z0000 Encounter for general adult medical examination without abnormal findings: Secondary | ICD-10-CM

## 2018-12-29 DIAGNOSIS — D229 Melanocytic nevi, unspecified: Secondary | ICD-10-CM

## 2018-12-29 LAB — POC URINALSYSI DIPSTICK (AUTOMATED)
Bilirubin, UA: NEGATIVE
Blood, UA: NEGATIVE
Glucose, UA: NEGATIVE
Ketones, UA: NEGATIVE
Nitrite, UA: NEGATIVE
Protein, UA: NEGATIVE
Spec Grav, UA: 1.015 (ref 1.010–1.025)
Urobilinogen, UA: 0.2 E.U./dL
pH, UA: 6 (ref 5.0–8.0)

## 2018-12-29 MED ORDER — TRIAMTERENE-HCTZ 37.5-25 MG PO TABS: 1.0000 | ORAL_TABLET | Freq: Every day | ORAL | 3 refills | Status: DC

## 2018-12-29 MED ORDER — FLUTICASONE PROPIONATE 50 MCG/ACT NA SUSP: 2.0000 | Freq: Every day | NASAL | 6 refills | Status: DC

## 2018-12-29 MED ORDER — SIMVASTATIN 20 MG PO TABS: 20.0000 mg | ORAL_TABLET | Freq: Every day | ORAL | 3 refills | Status: DC

## 2018-12-29 MED ORDER — CITALOPRAM HYDROBROMIDE 40 MG PO TABS: 40.0000 mg | ORAL_TABLET | Freq: Every day | ORAL | 3 refills | Status: DC

## 2018-12-29 NOTE — Progress Notes (Signed)
Subjective:     [name] is a 58 y.o. female and is here for a comprehensive physical exam. The patient reports problems - she was recently tx for uti--- she would like that recheckecd. She also needs refills and is requesting a referral to a dermatologist  Social History   Socioeconomic History  . Marital status: Divorced    Spouse name: Not on file  . Number of children: Not on file  . Years of education: Not on file  . Highest education level: Not on file  Occupational History  . Occupation: paralegal    Comment: paralegal --- foreclosure  Social Needs  . Financial resource strain: Not on file  . Food insecurity    Worry: Not on file    Inability: Not on file  . Transportation needs    Medical: Not on file    Non-medical: Not on file  Tobacco Use  . Smoking status: Never Smoker  . Smokeless tobacco: Never Used  Substance and Sexual Activity  . Alcohol use: Yes    Alcohol/week: 0.0 standard drinks  . Drug use: No  . Sexual activity: Yes    Partners: Male  Lifestyle  . Physical activity    Days per week: Not on file    Minutes per session: Not on file  . Stress: Not on file  Relationships  . Social Herbalist on phone: Not on file    Gets together: Not on file    Attends religious service: Not on file    Active member of club or organization: Not on file    Attends meetings of clubs or organizations: Not on file    Relationship status: Not on file  . Intimate partner violence    Fear of current or ex partner: Not on file    Emotionally abused: Not on file    Physically abused: Not on file    Forced sexual activity: Not on file  Other Topics Concern  . Not on file  Social History Narrative   Exercising-- walking    Health Maintenance  Topic Date Due  . MAMMOGRAM  02/17/2017  . INFLUENZA VACCINE  09/30/2018  . PAP SMEAR-Modifier  02/18/2019  . COLONOSCOPY  07/14/2023  . TETANUS/TDAP  11/23/2024  . Hepatitis C Screening  Completed  . HIV  Screening  Completed    The following portions of the patient's history were reviewed and updated as appropriate:  She  has a past medical history of Anxiety and Cervical cancer ([place]). She does not have any pertinent problems on file. She  has a past surgical history that includes Laparoscopic radical total hysterectomy w/ node biopsy; Radioactive seed guided excisional breast biopsy (Right, 04/01/2016); Abdominal hysterectomy (2008); and Colonoscopy (2015). Her family history includes Prostate cancer in her father. She  reports that she has never smoked. She has never used smokeless tobacco. She reports current alcohol use. She reports that she does not use drugs. She has a current medication list which includes the following prescription(s): biotin, cholecalciferol, fish oil, fluticasone, simvastatin, triamterene-hydrochlorothiazide, trimethoprim, and citalopram. Current Outpatient Medications on File Prior to Visit  Medication Sig Dispense Refill  . BIOTIN PO Take by mouth.    . cholecalciferol (VITAMIN D) 1000 units tablet Take 2 tablets (2,000 Units total) by mouth daily. 90 tablet 3  . Fish Oil OIL by Does not apply route.    . trimethoprim (TRIMPEX) 100 MG tablet Take 1 tablet (100 mg total) by mouth daily. [place]  tablet 3   No current facility-administered medications on file prior to visit.    She is allergic to vicodin [hydrocodone-acetaminophen]..  Review of Systems Review of Systems  Constitutional: Negative for activity change, appetite change and fatigue.  HENT: Negative for hearing loss, congestion, tinnitus and ear discharge.  dentist q45m Eyes: Negative for visual disturbance (see optho q1y -- vision corrected to 20/20 with glasses).  Respiratory: Negative for cough, chest tightness and shortness of breath.   Cardiovascular: Negative for chest pain, palpitations and leg swelling.  Gastrointestinal: Negative for abdominal pain, diarrhea, constipation and abdominal distention.   Genitourinary: Negative for urgency, frequency, decreased urine volume and difficulty urinating.  Musculoskeletal: Negative for back pain, arthralgias and gait problem.  Skin: Negative for color change, pallor and rash.  Neurological: Negative for dizziness, light-headedness, numbness and headaches.  Hematological: Negative for adenopathy. Does not bruise/bleed easily.  Psychiatric/Behavioral: Negative for suicidal ideas, confusion, sleep disturbance, self-injury, dysphoric mood, decreased concentration and agitation.        Objective:    BP 122/80 (BP Location: Right Arm, Patient Position: Sitting, Cuff Size: Normal)   Pulse 71   Temp (!) 97 F (36.1 C) (Temporal)   Resp 18   Ht 5' 5.6" (1.666 m)   Wt 182 lb 3.2 oz (82.6 kg)   SpO2 99%   BMI 29.77 kg/m  General appearance: alert, cooperative, appears stated age and no distress Head: Normocephalic, without obvious abnormality, atraumatic Eyes: conjunctivae/corneas clear. PERRL, EOM's intact. Fundi benign. Ears: normal TM's and external ear canals both ears Nose: Nares normal. Septum midline. Mucosa normal. No drainage or sinus tenderness. Throat: lips, mucosa, and tongue normal; teeth and gums normal Neck: no adenopathy, no carotid bruit, no JVD, supple, symmetrical, trachea midline and thyroid not enlarged, symmetric, no tenderness/mass/nodules Back: symmetric, no curvature. ROM normal. No CVA tenderness. Lungs: clear to auscultation bilaterally Breasts: gyn Heart: regular rate and rhythm, S1, S2 normal, no murmur, click, rub or gallop Abdomen: soft, non-tender; bowel sounds normal; no masses,  no organomegaly Pelvic: deferred-gyn Extremities: extremities normal, atraumatic, no cyanosis or edema Pulses: 2+ and symmetric Skin: mult moles  Lymph nodes: Cervical, supraclavicular, and axillary nodes normal. Neurologic: Alert and oriented X 3, normal strength and tone. Normal symmetric reflexes. Normal coordination and gait     Assessment:    Healthy female exam.     Plan:    ghm utd  Check labs  See After Visit Summary for Counseling Recommendations    1. Depression Stable Refill meds  - citalopram (CELEXA) 40 MG tablet; Take 1 tablet (40 mg total) by mouth daily.  Dispense: 90 tablet; Refill: 3  2. Sore throat Controlled with antihistamine and flonase - fluticasone (FLONASE) 50 MCG/ACT nasal spray; Place 2 sprays into both nostrils daily.  Dispense: 16 g; Refill: 6  3. Hyperlipidemia LDL goal <100 Tolerating statin, encouraged heart healthy diet, avoid trans fats, minimize simple carbs and saturated fats. Increase exercise as tolerated - simvastatin (ZOCOR) 20 MG tablet; Take 1 tablet (20 mg total) by mouth at bedtime.  Dispense: 90 tablet; Refill: 3 - Comprehensive metabolic panel - Lipid panel  4. Edema of extremities Stable Refill meds  - triamterene-hydrochlorothiazide (MAXZIDE-25) 37.5-25 MG tablet; Take 1 tablet by mouth daily.  Dispense: 90 tablet; Refill: 3  5. Essential hypertension Well controlled, no changes to meds. Encouraged heart healthy diet such as the DASH diet and exercise as tolerated.  - CBC with Differential/Platelet - Comprehensive metabolic panel - Lipid panel  6.  Preventative health care See above  - CBC with Differential/Platelet - Comprehensive metabolic panel - Lipid panel - TSH  7. Need for influenza vaccination   - Flu Vaccine QUAD 36+ mos IM  8. Need for shingles vaccine \ - Varicella-zoster vaccine IM  9. Suspicious nevus \ - Ambulatory referral to Dermatology  10. History of UTI Recheck ua-- - POCT Urinalysis Dipstick (Automated)  11. Leukocytes in urine  - Urine Culture

## 2018-12-29 NOTE — Patient Instructions (Signed)

## 2018-12-30 LAB — CBC WITH DIFFERENTIAL/PLATELET
Absolute Monocytes: 372 cells/uL (ref 200–950)
Basophils Absolute: 51 cells/uL (ref 0–200)
Basophils Relative: 1 %
Eosinophils Absolute: 189 cells/uL (ref 15–500)
Eosinophils Relative: 3.7 %
HCT: 41.3 % (ref 35.0–45.0)
Hemoglobin: 14.2 g/dL (ref 11.7–15.5)
Lymphs Abs: 1892 cells/uL (ref 850–3900)
MCH: 30.9 pg (ref 27.0–33.0)
MCHC: 34.4 g/dL (ref 32.0–36.0)
MCV: 90 fL (ref 80.0–100.0)
MPV: 11.4 fL (ref 7.5–12.5)
Monocytes Relative: 7.3 %
Neutro Abs: 2596 cells/uL (ref 1500–7800)
Neutrophils Relative %: 50.9 %
Platelets: 247 10*3/uL (ref 140–400)
RBC: 4.59 10*6/uL (ref 3.80–5.10)
RDW: 11.8 % (ref 11.0–15.0)
Total Lymphocyte: 37.1 %
WBC: 5.1 10*3/uL (ref 3.8–10.8)

## 2018-12-30 LAB — COMPREHENSIVE METABOLIC PANEL
AG Ratio: 2 (calc) (ref 1.0–2.5)
ALT: 14 U/L (ref 6–29)
AST: 18 U/L (ref 10–35)
Albumin: 4.5 g/dL (ref 3.6–5.1)
Alkaline phosphatase (APISO): 80 U/L (ref 37–153)
BUN: 17 mg/dL (ref 7–25)
CO2: 30 mmol/L (ref 20–32)
Calcium: 9.5 mg/dL (ref 8.6–10.4)
Chloride: 98 mmol/L (ref 98–110)
Creat: 0.93 mg/dL (ref 0.50–1.05)
Globulin: 2.3 g/dL (calc) (ref 1.9–3.7)
Glucose, Bld: 93 mg/dL (ref 65–99)
Potassium: 3.9 mmol/L (ref 3.5–5.3)
Sodium: 137 mmol/L (ref 135–146)
Total Bilirubin: 0.4 mg/dL (ref 0.2–1.2)
Total Protein: 6.8 g/dL (ref 6.1–8.1)

## 2018-12-30 LAB — LIPID PANEL
Cholesterol: 247 mg/dL — ABNORMAL HIGH (ref <200)
HDL: 57 mg/dL (ref >=50)
LDL Cholesterol (Calc): 163 mg/dL (calc) — ABNORMAL HIGH
Non-HDL Cholesterol (Calc): 190 mg/dL (calc) — ABNORMAL HIGH (ref <130)
Total CHOL/HDL Ratio: 4.3 (calc) (ref <5.0)
Triglycerides: 135 mg/dL (ref <150)

## 2018-12-30 LAB — TSH: TSH: 2.9 mIU/L (ref 0.40–4.50)

## 2018-12-31 LAB — URINE CULTURE
MICRO NUMBER:: 1049206
SPECIMEN QUALITY:: ADEQUATE

## 2019-08-22 ENCOUNTER — Telehealth: Payer: Self-pay | Admitting: Family Medicine

## 2019-08-22 DIAGNOSIS — F32A Depression, unspecified: Secondary | ICD-10-CM

## 2019-08-22 MED ORDER — CITALOPRAM HYDROBROMIDE 40 MG PO TABS: 40.0000 mg | ORAL_TABLET | Freq: Every day | ORAL | 0 refills | Status: DC

## 2019-08-22 NOTE — Telephone Encounter (Signed)
Refill sent.

## 2019-08-22 NOTE — Telephone Encounter (Signed)
Patient no medication left. Patient is out of town.  Medication: citalopram (CELEXA) 40 MG tablet      Has the patient contacted their pharmacy?  (If no, request that the patient contact the pharmacy for the refill.) (If yes, when and what did the pharmacy advise?)     Preferred Pharmacy (with phone number or street name): [facility] [address] [place], [place] [address] Phone number: [phone]     Agent: Please be advised that RX refills may take up to 3 business days. We ask that you follow-up with your pharmacy.

## 2019-09-28 ENCOUNTER — Other Ambulatory Visit: Payer: Self-pay | Admitting: Family Medicine

## 2019-09-28 DIAGNOSIS — F32A Depression, unspecified: Secondary | ICD-10-CM

## 2019-10-01 ENCOUNTER — Encounter: Payer: Self-pay | Admitting: Family Medicine

## 2019-10-02 ENCOUNTER — Other Ambulatory Visit: Payer: Self-pay | Admitting: Family Medicine

## 2019-10-02 DIAGNOSIS — F329 Major depressive disorder, single episode, unspecified: Secondary | ICD-10-CM

## 2019-10-02 DIAGNOSIS — F32A Depression, unspecified: Secondary | ICD-10-CM

## 2019-10-02 MED ORDER — CITALOPRAM HYDROBROMIDE 40 MG PO TABS: 40.0000 mg | ORAL_TABLET | Freq: Every day | ORAL | 0 refills | Status: DC

## 2019-10-02 NOTE — Telephone Encounter (Signed)
I refilled it

## 2019-10-03 MED ORDER — CITALOPRAM HYDROBROMIDE 40 MG PO TABS: 40.0000 mg | ORAL_TABLET | Freq: Every day | ORAL | 0 refills | Status: DC

## 2019-11-19 ENCOUNTER — Other Ambulatory Visit: Payer: Self-pay

## 2019-11-19 ENCOUNTER — Ambulatory Visit: Payer: PRIVATE HEALTH INSURANCE | Admitting: Family

## 2019-11-19 ENCOUNTER — Encounter: Payer: Self-pay | Admitting: Family

## 2019-11-19 VITALS — BP 139/83 | HR 68 | Temp 98.5°F | Resp 16 | Ht 65.6 in | Wt 181.0 lb

## 2019-11-19 DIAGNOSIS — L0291 Cutaneous abscess, unspecified: Secondary | ICD-10-CM

## 2019-11-19 MED ORDER — DOXYCYCLINE HYCLATE 100 MG PO TABS: 100.0000 mg | ORAL_TABLET | Freq: Two times a day (BID) | ORAL | 0 refills | Status: DC

## 2019-11-19 MED FILL — DOXYCYCLINE HYCLATE 100 MG: 100 | 7 days supply | Qty: 14 | Fill #0

## 2019-11-19 NOTE — Progress Notes (Signed)
Subjective:    Patient ID: [name], female    DOB: 21-Mar-1960, 59 y.o.   MRN: [id number]  HPI  Patient is a 59 yr old female who presents today with c/o skin infection under her cesarean scar.  Area is painful. She first noticed on 9/9.  Felt like a "little cyst." Continued to increase. On Thursday it worsened.  Reports some scant drainage. Has hx of axillary hidradenitis but has never had an abscess in this area before.   Review of Systems See HPI  Past Medical History:  Diagnosis Date  . Anxiety   . Cervical cancer [facility])      Social History   Socioeconomic History  . Marital status: Divorced    Spouse name: Not on file  . Number of children: Not on file  . Years of education: Not on file  . Highest education level: Not on file  Occupational History  . Occupation: paralegal    Comment: paralegal --- foreclosure  Tobacco Use  . Smoking status: Never Smoker  . Smokeless tobacco: Never Used  Substance and Sexual Activity  . Alcohol use: Yes    Alcohol/week: 0.0 standard drinks  . Drug use: No  . Sexual activity: Yes    Partners: Male  Other Topics Concern  . Not on file  Social History Narrative   Exercising-- walking    Social Determinants of Health   Financial Resource Strain:   . Difficulty of Paying Living Expenses: Not on file  Food Insecurity:   . Worried About Charity fundraiser in the Last Year: Not on file  . Ran Out of Food in the Last Year: Not on file  Transportation Needs:   . Lack of Transportation (Medical): Not on file  . Lack of Transportation (Non-Medical): Not on file  Physical Activity:   . Days of Exercise per Week: Not on file  . Minutes of Exercise per Session: Not on file  Stress:   . Feeling of Stress : Not on file  Social Connections:   . Frequency of Communication with Friends and Family: Not on file  . Frequency of Social Gatherings with Friends and Family: Not on file  . Attends Religious Services: Not on file  . Active  Member of Clubs or Organizations: Not on file  . Attends Archivist Meetings: Not on file  . Marital Status: Not on file  Intimate Partner Violence:   . Fear of Current or Ex-Partner: Not on file  . Emotionally Abused: Not on file  . Physically Abused: Not on file  . Sexually Abused: Not on file    Past Surgical History:  Procedure Laterality Date  . ABDOMINAL HYSTERECTOMY  2008  . COLONOSCOPY  2015  . LAPAROSCOPIC RADICAL TOTAL HYSTERECTOMY W/ NODE BIOPSY     still has ovaries  . RADIOACTIVE SEED GUIDED EXCISIONAL BREAST BIOPSY Right 04/01/2016   Procedure: RADIOACTIVE SEED GUIDED EXCISIONAL BREAST BIOPSY;  Surgeon: Rolm Bookbinder, MD;  Location: [place];  Service: General;  Laterality: Right;    Family History  Problem Relation Age of Onset  . Prostate cancer Father     Allergies  Allergen Reactions  . Vicodin [Hydrocodone-Acetaminophen] Rash    Current Outpatient Medications on File Prior to Visit  Medication Sig Dispense Refill  . BIOTIN PO Take by mouth.    . cholecalciferol (VITAMIN D) 1000 units tablet Take 2 tablets (2,000 Units total) by mouth daily. 90 tablet 3  . citalopram (CELEXA) 40  MG tablet Take 1 tablet (40 mg total) by mouth daily. 90 tablet 0  . Fish Oil OIL by Does not apply route.    . fluticasone (FLONASE) 50 MCG/ACT nasal spray Place 2 sprays into both nostrils daily. 16 g 6  . simvastatin (ZOCOR) 20 MG tablet Take 1 tablet (20 mg total) by mouth at bedtime. 90 tablet 3  . triamterene-hydrochlorothiazide (MAXZIDE-25) 37.5-25 MG tablet Take 1 tablet by mouth daily. 90 tablet 3  . trimethoprim (TRIMPEX) 100 MG tablet Take 1 tablet (100 mg total) by mouth daily. 90 tablet 3   No current facility-administered medications on file prior to visit.    BP 139/83 (BP Location: Right Arm, Patient Position: Sitting, Cuff Size: Small)   Pulse 68   Temp 98.5 F (36.9 C) (Oral)   Resp 16   Ht 5' 5.6" (1.666 m)   Wt 181 lb (82.1  kg)   SpO2 99%   BMI 29.57 kg/m       Objective:   Physical Exam Constitutional:      Appearance: She is well-developed.  Neck:     Thyroid: No thyromegaly.  Cardiovascular:     Rate and Rhythm: Normal rate and regular rhythm.     Heart sounds: Normal heart sounds. No murmur heard.   Pulmonary:     Effort: Pulmonary effort is normal. No respiratory distress.     Breath sounds: Normal breath sounds. No wheezing.  Musculoskeletal:     Cervical back: Neck supple.  Skin:    General: Skin is warm and dry.     Comments: + tenderness and erythema noted in middle of c-section scar. Very small amount of fluctuance noted. Surrounding induration is also noted.   Neurological:     Mental Status: She is alert and oriented to person, place, and time.  Psychiatric:        Behavior: Behavior normal.        Thought Content: Thought content normal.        Judgment: Judgment normal.           Assessment & Plan:  Abscess/cellulitis- New. Pt is advised as follows:  Please begin doxycycline twice daily (antibiotic). Apply warm compresses or do warm tub soaks twice daily. Call if increased/pain/redness.  Follow up in 1 week.  Would consider I and D next visit if area is not improved.   This visit occurred during the SARS-CoV-2 public health emergency.  Safety protocols were in place, including screening questions prior to the visit, additional usage of staff PPE, and extensive cleaning of exam room while observing appropriate contact time as indicated for disinfecting solutions.

## 2019-11-19 NOTE — Patient Instructions (Signed)
Please begin doxycycline twice daily (antibiotic). Apply warm compresses or do warm tub soaks twice daily. Call if increased/pain/redness.

## 2019-11-27 ENCOUNTER — Ambulatory Visit: Payer: PRIVATE HEALTH INSURANCE | Admitting: Family

## 2019-11-27 DIAGNOSIS — Z0289 Encounter for other administrative examinations: Secondary | ICD-10-CM

## 2020-01-04 ENCOUNTER — Encounter: Payer: PRIVATE HEALTH INSURANCE | Admitting: Family Medicine

## 2020-02-10 ENCOUNTER — Other Ambulatory Visit: Payer: Self-pay | Admitting: Family Medicine

## 2020-02-10 DIAGNOSIS — R600 Localized edema: Secondary | ICD-10-CM

## 2020-02-15 ENCOUNTER — Encounter: Payer: PRIVATE HEALTH INSURANCE | Admitting: Family Medicine

## 2020-05-09 ENCOUNTER — Other Ambulatory Visit: Payer: Self-pay | Admitting: Family Medicine

## 2020-05-09 DIAGNOSIS — R600 Localized edema: Secondary | ICD-10-CM

## 2020-05-12 NOTE — Telephone Encounter (Signed)
LM requesting call back. Patient needs OV or CPE scheduled.

## 2020-05-15 ENCOUNTER — Encounter: Payer: Self-pay | Admitting: Family Medicine

## 2020-05-15 ENCOUNTER — Other Ambulatory Visit: Payer: Self-pay

## 2020-05-15 ENCOUNTER — Ambulatory Visit: Payer: BC Managed Care – PPO | Admitting: Family Medicine

## 2020-05-15 VITALS — BP 120/70 | HR 61 | Temp 98.1°F | Resp 16 | Ht 65.5 in | Wt 186.8 lb

## 2020-05-15 DIAGNOSIS — R600 Localized edema: Secondary | ICD-10-CM | POA: Diagnosis not present

## 2020-05-15 DIAGNOSIS — I10 Essential (primary) hypertension: Secondary | ICD-10-CM | POA: Insufficient documentation

## 2020-05-15 DIAGNOSIS — M545 Low back pain, unspecified: Secondary | ICD-10-CM

## 2020-05-15 DIAGNOSIS — E785 Hyperlipidemia, unspecified: Secondary | ICD-10-CM | POA: Diagnosis not present

## 2020-05-15 DIAGNOSIS — F419 Anxiety disorder, unspecified: Secondary | ICD-10-CM

## 2020-05-15 DIAGNOSIS — N390 Urinary tract infection, site not specified: Secondary | ICD-10-CM | POA: Insufficient documentation

## 2020-05-15 HISTORY — DX: Morbid (severe) obesity due to excess calories: E66.01

## 2020-05-15 MED ORDER — SIMVASTATIN 20 MG PO TABS: 20.0000 mg | ORAL_TABLET | Freq: Every day | ORAL | 3 refills | Status: DC

## 2020-05-15 MED ORDER — SERTRALINE HCL 50 MG PO TABS: 50.0000 mg | ORAL_TABLET | Freq: Every day | ORAL | 3 refills | Status: DC

## 2020-05-15 MED ORDER — TRIAMTERENE-HCTZ 37.5-25 MG PO TABS: 1.0000 | ORAL_TABLET | Freq: Every day | ORAL | 1 refills | Status: DC

## 2020-05-15 MED ORDER — TRIMETHOPRIM 100 MG PO TABS
100.0000 mg | ORAL_TABLET | Freq: Every day | ORAL | 3 refills | Status: DC
Start: 2020-05-15 — End: 2021-03-19

## 2020-05-15 NOTE — Progress Notes (Signed)
Patient ID: [name], female    DOB: 24-Jan-1961  Age: 60 y.o. MRN: [id number]    Subjective:  Subjective  HPI Rakeb Kibble presents for inc in anxiety.  The celexa is not working --- she ran out 3-4 days ago.     She fell a few months ago and injured her R knee and leg.   She also c/o low back pain x few months  Review of Systems  Constitutional: Negative for appetite change, diaphoresis, fatigue and unexpected weight change.  Eyes: Negative for pain, redness and visual disturbance.  Respiratory: Negative for cough, chest tightness, shortness of breath and wheezing.   Cardiovascular: Negative for chest pain, palpitations and leg swelling.  Endocrine: Negative for cold intolerance, heat intolerance, polydipsia, polyphagia and polyuria.  Genitourinary: Negative for difficulty urinating, dysuria and frequency.  Neurological: Negative for dizziness, light-headedness, numbness and headaches.    History Past Medical History:  Diagnosis Date  . Anxiety   . Cervical cancer [facility])     She has a past surgical history that includes Laparoscopic radical total hysterectomy w/ node biopsy; Radioactive seed guided excisional breast biopsy (Right, 04/01/2016); Abdominal hysterectomy (2008); and Colonoscopy (2015).   Her family history includes Prostate cancer in her father.She reports that she has never smoked. She has never used smokeless tobacco. She reports current alcohol use. She reports that she does not use drugs.  Current Outpatient Medications on File Prior to Visit  Medication Sig Dispense Refill  . BIOTIN PO Take by mouth.    . cholecalciferol (VITAMIN D) 1000 units tablet Take 2 tablets (2,000 Units total) by mouth daily. 90 tablet 3  . citalopram (CELEXA) 40 MG tablet Take 1 tablet (40 mg total) by mouth daily. 90 tablet 0  . Fish Oil OIL by Does not apply route.    . fluticasone (FLONASE) 50 MCG/ACT nasal spray Place 2 sprays into both nostrils daily. 16 g 6   No current  facility-administered medications on file prior to visit.     Objective:  Objective  Physical Exam Vitals and nursing note reviewed.  Constitutional:      Appearance: She is well-developed.  HENT:     Head: Normocephalic and atraumatic.  Eyes:     Conjunctiva/sclera: Conjunctivae normal.  Neck:     Thyroid: No thyromegaly.     Vascular: No carotid bruit or JVD.  Cardiovascular:     Rate and Rhythm: Normal rate and regular rhythm.     Heart sounds: Normal heart sounds. No murmur heard.   Pulmonary:     Effort: Pulmonary effort is normal. No respiratory distress.     Breath sounds: Normal breath sounds. No wheezing or rales.  Chest:     Chest wall: No tenderness.  Musculoskeletal:     Cervical back: Normal range of motion and neck supple.     Right lower leg: Tenderness present.       Legs:  Neurological:     Mental Status: She is alert and oriented to person, place, and time.  Psychiatric:        Attention and Perception: Attention and perception normal.        Mood and Affect: Mood is anxious and depressed. Mood is not elated. Affect is not labile, blunt, flat, angry, tearful or inappropriate.        Speech: Speech normal.        Behavior: Behavior normal.        Thought Content: Thought content normal. Thought content is not  paranoid or delusional. Thought content does not include homicidal or suicidal ideation. Thought content does not include homicidal or suicidal plan.        Cognition and Memory: Cognition normal.        Judgment: Judgment normal.    BP 120/70 (BP Location: Right Arm, Patient Position: Sitting, Cuff Size: Normal)   Pulse 61   Temp 98.1 F (36.7 C) (Oral)   Resp 16   Ht 5' 5.5" (1.664 m)   Wt 186 lb 12.8 oz (84.7 kg)   SpO2 99%   BMI 30.61 kg/m  Wt Readings from Last 3 Encounters:  05/15/20 186 lb 12.8 oz (84.7 kg)  11/19/19 181 lb (82.1 kg)  12/29/18 182 lb 3.2 oz (82.6 kg)     Lab Results  Component Value Date   WBC 5.1 12/29/2018    HGB 14.2 12/29/2018   HCT 41.3 12/29/2018   PLT 247 12/29/2018   GLUCOSE 93 12/29/2018   CHOL 247 (H) 12/29/2018   TRIG 135 12/29/2018   HDL 57 12/29/2018   LDLCALC 163 (H) 12/29/2018   ALT 14 12/29/2018   AST 18 12/29/2018   NA 137 12/29/2018   K 3.9 12/29/2018   CL 98 12/29/2018   CREATININE 0.93 12/29/2018   BUN 17 12/29/2018   CO2 30 12/29/2018   TSH 2.90 12/29/2018   INR 0.98 11/24/2014   MICROALBUR 0.2 04/09/2013    No results found.   Assessment & Plan:  Plan  I have discontinued Ember Kotlyar's doxycycline. I have also changed her triamterene-hydrochlorothiazide. Additionally, I am having her start on sertraline. Lastly, I am having her maintain her BIOTIN PO, Fish Oil, cholecalciferol, fluticasone, citalopram, simvastatin, and trimethoprim.  Meds ordered this encounter  Medications  . simvastatin (ZOCOR) 20 MG tablet    Sig: Take 1 tablet (20 mg total) by mouth at bedtime.    Dispense:  90 tablet    Refill:  3  . triamterene-hydrochlorothiazide (MAXZIDE-25) 37.5-25 MG tablet    Sig: Take 1 tablet by mouth daily.    Dispense:  90 tablet    Refill:  1  . trimethoprim (TRIMPEX) 100 MG tablet    Sig: Take 1 tablet (100 mg total) by mouth daily.    Dispense:  90 tablet    Refill:  3  . sertraline (ZOLOFT) 50 MG tablet    Sig: Take 1 tablet (50 mg total) by mouth daily.    Dispense:  30 tablet    Refill:  3    Problem List Items Addressed This Visit      Unprioritized   Acute midline low back pain with right-sided sciatica    Pt is blaming pain on weight and is struggling to lose weight Discussed a few options and also discuss exercise but she is having a hard time getting started due to her mothers illness  Can xray if no improvement Tylenol/ advil ,  Ice/ heat        Relevant Medications   sertraline (ZOLOFT) 50 MG tablet   Anxiety - Primary   Relevant Medications   sertraline (ZOLOFT) 50 MG tablet   Other Relevant Orders   TSH   Edema of  extremities   Relevant Medications   triamterene-hydrochlorothiazide (MAXZIDE-25) 37.5-25 MG tablet   Hyperlipidemia LDL goal <100    Encouraged heart healthy diet, increase exercise, avoid trans fats, consider a krill oil cap daily      Relevant Medications   simvastatin (ZOCOR) 20 MG tablet  triamterene-hydrochlorothiazide (MAXZIDE-25) 37.5-25 MG tablet   Other Relevant Orders   Lipid panel   Comprehensive metabolic panel   Morbid obesity ([place])    Discussed different plans she can try including healthy weight and wellness, [employer], dash diet,  My fitness pal  Pt states diet and exercise have been hard due to her mothers illness       Primary hypertension    Well controlled, no changes to meds. Encouraged heart healthy diet such as the DASH diet and exercise as tolerated.       Relevant Medications   simvastatin (ZOCOR) 20 MG tablet   triamterene-hydrochlorothiazide (MAXZIDE-25) 37.5-25 MG tablet   Other Relevant Orders   Lipid panel   Comprehensive metabolic panel   Recurrent UTI   Relevant Medications   trimethoprim (TRIMPEX) 100 MG tablet      Follow-up: Return in about 4 weeks (around 06/12/2020), or if symptoms worsen or fail to improve, for depression.  Ann Held, DO

## 2020-05-15 NOTE — Assessment & Plan Note (Signed)
Well controlled, no changes to meds. Encouraged heart healthy diet such as the DASH diet and exercise as tolerated.  °

## 2020-05-15 NOTE — Assessment & Plan Note (Signed)
Discussed different plans she can try including healthy weight and wellness, [employer], dash diet,  My fitness pal  Pt states diet and exercise have been hard due to her mothers illness

## 2020-05-15 NOTE — Patient Instructions (Signed)

## 2020-05-15 NOTE — Assessment & Plan Note (Signed)
Pt is blaming pain on weight and is struggling to lose weight Discussed a few options and also discuss exercise but she is having a hard time getting started due to her mothers illness  Can xray if no improvement Tylenol/ advil ,  Ice/ heat

## 2020-05-15 NOTE — Assessment & Plan Note (Signed)
Encouraged heart healthy diet, increase exercise, avoid trans fats, consider a krill oil cap daily 

## 2020-05-16 LAB — COMPREHENSIVE METABOLIC PANEL
ALT: 12 U/L (ref 0–35)
AST: 16 U/L (ref 0–37)
Albumin: 3.9 g/dL (ref 3.5–5.2)
Alkaline Phosphatase: 74 U/L (ref 39–117)
BUN: 17 mg/dL (ref 6–23)
CO2: 32 mEq/L (ref 19–32)
Calcium: 9.1 mg/dL (ref 8.4–10.5)
Chloride: 102 mEq/L (ref 96–112)
Creatinine, Ser: 0.85 mg/dL (ref 0.40–1.20)
GFR: 75.07 mL/min (ref >60.00)
Glucose, Bld: 103 mg/dL — ABNORMAL HIGH (ref 70–99)
Potassium: 3.7 mEq/L (ref 3.5–5.1)
Sodium: 141 mEq/L (ref 135–145)
Total Bilirubin: 0.3 mg/dL (ref 0.2–1.2)
Total Protein: 5.9 g/dL — ABNORMAL LOW (ref 6.0–8.3)

## 2020-05-16 LAB — LIPID PANEL
Cholesterol: 198 mg/dL (ref 0–200)
HDL: 50.8 mg/dL (ref >39.00)
LDL Cholesterol: 124 mg/dL — ABNORMAL HIGH (ref 0–99)
NonHDL: 147.61
Total CHOL/HDL Ratio: 4
Triglycerides: 118 mg/dL (ref 0.0–149.0)
VLDL: 23.6 mg/dL (ref 0.0–40.0)

## 2020-05-16 LAB — TSH: TSH: 2.43 u[IU]/mL (ref 0.35–4.50)

## 2020-05-16 NOTE — Telephone Encounter (Signed)
Pt was supposed to discontinue citalopram? Please advise

## 2020-05-16 NOTE — Telephone Encounter (Signed)
We changed it to zoloft because celexa was not working and she had been off of it for several days

## 2020-09-29 ENCOUNTER — Encounter: Payer: Self-pay | Admitting: Family Medicine

## 2020-09-29 ENCOUNTER — Telehealth: Payer: BC Managed Care – PPO | Admitting: Nurse Practitioner

## 2020-09-29 DIAGNOSIS — N3000 Acute cystitis without hematuria: Secondary | ICD-10-CM | POA: Diagnosis not present

## 2020-09-29 MED ORDER — CEPHALEXIN 500 MG PO CAPS: 500.0000 mg | ORAL_CAPSULE | Freq: Two times a day (BID) | ORAL | 0 refills | Status: AC

## 2020-09-29 NOTE — Progress Notes (Signed)
E-Visit for Urinary Problems  We are sorry that you are not feeling well.  Here is how we plan to help!  Based on what you shared with me it looks like you most likely have a simple urinary tract infection.  A UTI (Urinary Tract Infection) is a bacterial infection of the bladder.  Most cases of urinary tract infections are simple to treat but a key part of your care is to encourage you to drink plenty of fluids and watch your symptoms carefully.  I have prescribed Keflex 500 mg twice a day for 7 days.  Your symptoms should gradually improve. Call us if the burning in your urine worsens, you develop worsening fever, back pain or pelvic pain or if your symptoms do not resolve after completing the antibiotic.  Urinary tract infections can be prevented by drinking plenty of water to keep your body hydrated.  Also be sure when you wipe, wipe from front to back and don't hold it in!  If possible, empty your bladder every 4 hours.  HOME CARE Drink plenty of fluids Compete the full course of the antibiotics even if the symptoms resolve Remember, when you need to go.go. Holding in your urine can increase the likelihood of getting a UTI! GET HELP RIGHT AWAY IF: You cannot urinate You get a high fever Worsening back pain occurs You see blood in your urine You feel sick to your stomach or throw up You feel like you are going to pass out  MAKE SURE YOU  Understand these instructions. Will watch your condition. Will get help right away if you are not doing well or get worse.   Thank you for choosing an e-visit.  Your e-visit answers were reviewed by a board certified advanced clinical practitioner to complete your personal care plan. Depending upon the condition, your plan could have included both over the counter or prescription medications.  Please review your pharmacy choice. Make sure the pharmacy is open so you can pick up prescription now. If there is a problem, you may contact your  provider through MyChart messaging and have the prescription routed to another pharmacy.  Your safety is important to us. If you have drug allergies check your prescription carefully.   For the next 24 hours you can use MyChart to ask questions about today's visit, request a non-urgent call back, or ask for a work or school excuse. You will get an email in the next two days asking about your experience. I hope that your e-visit has been valuable and will speed your recovery.   Meds ordered this encounter  Medications   cephALEXin (KEFLEX) 500 MG capsule    Sig: Take 1 capsule (500 mg total) by mouth 2 (two) times daily for 7 days.    Dispense:  14 capsule    Refill:  0    I spent approximately 7 minutes reviewing the patient's history, current symptoms and coordinating their plan of care today.   

## 2020-10-04 ENCOUNTER — Other Ambulatory Visit: Payer: Self-pay | Admitting: Family Medicine

## 2020-10-04 DIAGNOSIS — F419 Anxiety disorder, unspecified: Secondary | ICD-10-CM

## 2020-11-06 ENCOUNTER — Other Ambulatory Visit: Payer: Self-pay | Admitting: Family Medicine

## 2020-11-06 DIAGNOSIS — F419 Anxiety disorder, unspecified: Secondary | ICD-10-CM

## 2020-11-11 ENCOUNTER — Other Ambulatory Visit: Payer: Self-pay | Admitting: Family Medicine

## 2020-11-11 DIAGNOSIS — F419 Anxiety disorder, unspecified: Secondary | ICD-10-CM

## 2020-12-24 ENCOUNTER — Telehealth: Payer: BC Managed Care – PPO | Admitting: Physician Assistant

## 2020-12-24 DIAGNOSIS — J069 Acute upper respiratory infection, unspecified: Secondary | ICD-10-CM | POA: Diagnosis not present

## 2020-12-24 MED ORDER — FLUTICASONE PROPIONATE 50 MCG/ACT NA SUSP: 2.0000 | Freq: Every day | NASAL | 0 refills | Status: DC

## 2020-12-24 MED ORDER — BENZONATATE 100 MG PO CAPS
100.0000 mg | ORAL_CAPSULE | Freq: Three times a day (TID) | ORAL | 0 refills | Status: DC | PRN
Start: 2020-12-24 — End: 2021-03-19

## 2020-12-24 NOTE — Progress Notes (Signed)
I have spent 5 minutes in review of e-visit questionnaire, review and updating patient chart, medical decision making and response to patient.   [name] Cody Jsean Taussig, PA-C    

## 2020-12-24 NOTE — Progress Notes (Signed)
E-Visit for Upper Respiratory Infection   We are sorry you are not feeling well.  Here is how we plan to help!  Based on what you have shared with me, it looks like you may have a viral upper respiratory infection.  Upper respiratory infections are caused by a large number of viruses; however, rhinovirus is the most common cause.   I would still recommend a COVID test just to be cautious giving rates of infection in our area.  Symptoms vary from person to person, with common symptoms including sore throat, cough, fatigue or lack of energy and feeling of general discomfort.  A low-grade fever of up to 100.4 may present, but is often uncommon.  Symptoms vary however, and are closely related to a person's age or underlying illnesses.  The most common symptoms associated with an upper respiratory infection are nasal discharge or congestion, cough, sneezing, headache and pressure in the ears and face.  These symptoms usually persist for about 3 to 10 days, but can last up to 2 weeks.  It is important to know that upper respiratory infections do not cause serious illness or complications in most cases.    Upper respiratory infections can be transmitted from person to person, with the most common method of transmission being a person's hands.  The virus is able to live on the skin and can infect other persons for up to 2 hours after direct contact.  Also, these can be transmitted when someone coughs or sneezes; thus, it is important to cover the mouth to reduce this risk.  To keep the spread of the illness at [place], good hand hygiene is very important.  This is an infection that is most likely caused by a virus. There are no specific treatments other than to help you with the symptoms until the infection runs its course.  We are sorry you are not feeling well.  Here is how we plan to help!   For nasal congestion, you may use an oral decongestants such as Mucinex D or if you have glaucoma or high blood pressure  use plain Mucinex.  Saline nasal spray or nasal drops can help and can safely be used as often as needed for congestion.  For your congestion, I have prescribed Fluticasone nasal spray one spray in each nostril twice a day  If you do not have a history of heart disease, hypertension, diabetes or thyroid disease, prostate/bladder issues or glaucoma, you may also use Sudafed to treat nasal congestion.  It is highly recommended that you consult with a pharmacist or your primary care physician to ensure this medication is safe for you to take.     If you have a cough, you may use cough suppressants such as Delsym and Robitussin.  If you have glaucoma or high blood pressure, you can also use Coricidin HBP.   For cough I have prescribed for you A prescription cough medication called Tessalon Perles 100 mg. You may take 1-2 capsules every 8 hours as needed for cough  If you have a sore or scratchy throat, use a saltwater gargle-  to  teaspoon of salt dissolved in a 4-ounce to 8-ounce glass of warm water.  Gargle the solution for approximately 15-30 seconds and then spit.  It is important not to swallow the solution.  You can also use throat lozenges/cough drops and Chloraseptic spray to help with throat pain or discomfort.  Warm or cold liquids can also be helpful in relieving throat pain.  For headache, pain or general discomfort, you can use Ibuprofen or Tylenol as directed.   Some authorities believe that zinc sprays or the use of Echinacea may shorten the course of your symptoms.   HOME CARE Only take medications as instructed by your medical team. Be sure to drink plenty of fluids. Water is fine as well as fruit juices, sodas and electrolyte beverages. You may want to stay away from caffeine or alcohol. If you are nauseated, try taking small sips of liquids. How do you know if you are getting enough fluid? Your urine should be a pale yellow or almost colorless. Get rest. Taking a steamy shower or  using a humidifier may help nasal congestion and ease sore throat pain. You can place a towel over your head and breathe in the steam from hot water coming from a faucet. Using a saline nasal spray works much the same way. Cough drops, hard candies and sore throat lozenges may ease your cough. Avoid close contacts especially the very young and the elderly Cover your mouth if you cough or sneeze Always remember to wash your hands.   GET HELP RIGHT AWAY IF: You develop worsening fever. If your symptoms do not improve within 10 days You develop yellow or green discharge from your nose over 3 days. You have coughing fits You develop a severe head ache or visual changes. You develop shortness of breath, difficulty breathing or start having chest pain Your symptoms persist after you have completed your treatment plan  MAKE SURE YOU  Understand these instructions. Will watch your condition. Will get help right away if you are not doing well or get worse.  Thank you for choosing an e-visit.  Your e-visit answers were reviewed by a board certified advanced clinical practitioner to complete your personal care plan. Depending upon the condition, your plan could have included both over the counter or prescription medications.  Please review your pharmacy choice. Make sure the pharmacy is open so you can pick up prescription now. If there is a problem, you may contact your provider through [employer] and have the prescription routed to another pharmacy.  Your safety is important to [place]. If you have drug allergies check your prescription carefully.   For the next 24 hours you can use MyChart to ask questions about today's visit, request a non-urgent call back, or ask for a work or school excuse. You will get an email in the next two days asking about your experience. I hope that your e-visit has been valuable and will speed your recovery.

## 2021-01-21 DIAGNOSIS — M25562 Pain in left knee: Secondary | ICD-10-CM | POA: Diagnosis not present

## 2021-01-21 DIAGNOSIS — S8392XA Sprain of unspecified site of left knee, initial encounter: Secondary | ICD-10-CM | POA: Diagnosis not present

## 2021-02-12 DIAGNOSIS — B349 Viral infection, unspecified: Secondary | ICD-10-CM | POA: Diagnosis not present

## 2021-02-12 DIAGNOSIS — Z20822 Contact with and (suspected) exposure to covid-19: Secondary | ICD-10-CM | POA: Diagnosis not present

## 2021-02-12 DIAGNOSIS — R059 Cough, unspecified: Secondary | ICD-10-CM | POA: Diagnosis not present

## 2021-02-12 DIAGNOSIS — J209 Acute bronchitis, unspecified: Secondary | ICD-10-CM | POA: Diagnosis not present

## 2021-02-25 ENCOUNTER — Other Ambulatory Visit: Payer: Self-pay | Admitting: Family Medicine

## 2021-02-25 DIAGNOSIS — F419 Anxiety disorder, unspecified: Secondary | ICD-10-CM

## 2021-03-03 ENCOUNTER — Other Ambulatory Visit: Payer: Self-pay | Admitting: Family Medicine

## 2021-03-03 ENCOUNTER — Telehealth: Payer: Self-pay

## 2021-03-03 DIAGNOSIS — F419 Anxiety disorder, unspecified: Secondary | ICD-10-CM

## 2021-03-03 NOTE — Telephone Encounter (Signed)
Initial Comment [name] states she needs a medication refill. Disp. Time Disposition Final User 03/03/2021 7:23:48 AM General Information Provided Yes Paulita Cradle Call Closed By: Paulita Cradle Transaction Date/Time: 03/03/2021 7:21:58 AM (ET)

## 2021-03-04 MED ORDER — SERTRALINE HCL 50 MG PO TABS: 50.0000 mg | ORAL_TABLET | Freq: Every day | ORAL | 0 refills | Status: DC

## 2021-03-04 NOTE — Telephone Encounter (Signed)
Patient has appt scheduled for 03/23/21.  She needed sertraline.  Rx sent in.

## 2021-03-19 ENCOUNTER — Encounter: Payer: Self-pay | Admitting: Family Medicine

## 2021-03-19 ENCOUNTER — Other Ambulatory Visit (HOSPITAL_BASED_OUTPATIENT_CLINIC_OR_DEPARTMENT_OTHER): Payer: Self-pay | Admitting: Family Medicine

## 2021-03-19 ENCOUNTER — Ambulatory Visit (INDEPENDENT_AMBULATORY_CARE_PROVIDER_SITE_OTHER): Payer: BC Managed Care – PPO | Admitting: Family Medicine

## 2021-03-19 ENCOUNTER — Encounter: Payer: BC Managed Care – PPO | Admitting: Family Medicine

## 2021-03-19 VITALS — BP 128/80 | HR 62 | Temp 98.4°F | Resp 18 | Ht 65.5 in | Wt 188.6 lb

## 2021-03-19 DIAGNOSIS — N390 Urinary tract infection, site not specified: Secondary | ICD-10-CM

## 2021-03-19 DIAGNOSIS — E785 Hyperlipidemia, unspecified: Secondary | ICD-10-CM | POA: Diagnosis not present

## 2021-03-19 DIAGNOSIS — F418 Other specified anxiety disorders: Secondary | ICD-10-CM | POA: Insufficient documentation

## 2021-03-19 DIAGNOSIS — W19XXXA Unspecified fall, initial encounter: Secondary | ICD-10-CM

## 2021-03-19 DIAGNOSIS — Z23 Encounter for immunization: Secondary | ICD-10-CM | POA: Diagnosis not present

## 2021-03-19 DIAGNOSIS — E2839 Other primary ovarian failure: Secondary | ICD-10-CM

## 2021-03-19 DIAGNOSIS — F419 Anxiety disorder, unspecified: Secondary | ICD-10-CM

## 2021-03-19 DIAGNOSIS — Z0000 Encounter for general adult medical examination without abnormal findings: Secondary | ICD-10-CM | POA: Diagnosis not present

## 2021-03-19 DIAGNOSIS — I10 Essential (primary) hypertension: Secondary | ICD-10-CM | POA: Diagnosis not present

## 2021-03-19 DIAGNOSIS — Z1231 Encounter for screening mammogram for malignant neoplasm of breast: Secondary | ICD-10-CM

## 2021-03-19 MED ORDER — BUPROPION HCL ER (XL) 150 MG PO TB24: 150.0000 mg | ORAL_TABLET | Freq: Every day | ORAL | 1 refills | Status: DC

## 2021-03-19 MED ORDER — TRIMETHOPRIM 100 MG PO TABS: 100.0000 mg | ORAL_TABLET | Freq: Every day | ORAL | 3 refills | Status: DC

## 2021-03-19 MED ORDER — SIMVASTATIN 20 MG PO TABS: 20.0000 mg | ORAL_TABLET | Freq: Every day | ORAL | 3 refills | Status: DC

## 2021-03-19 MED ORDER — CITALOPRAM HYDROBROMIDE 20 MG PO TABS: 20.0000 mg | ORAL_TABLET | Freq: Every day | ORAL | 1 refills | Status: DC

## 2021-03-19 NOTE — Assessment & Plan Note (Signed)
D/c zoloft Restart celexa and wellbutrin  F/u 1 month or sooner prn

## 2021-03-19 NOTE — Progress Notes (Addendum)
Subjective:   By signing my name below, I, [name], attest that this documentation has been prepared under the direction and in the presence of Dr. Roma Schanz, DO. 03/19/2021    Patient ID: [name], female    DOB: 1961-01-16, 61 y.o.   MRN: [id number]  Chief Complaint  Patient presents with   Annual Exam    Pt states not fasting.     HPI Patient is in today for a comprehensive physical exam.  She continues taking 50 mg Zoloft daily PO, 40 mg Celexa daily PO. She reports experiencing brain fog and increased anxiety since recovering from Covid-19. She is interested increasing her dosage to manage her symptoms. She is interested in seeing a counselor to manage her symptoms as well.  She is due for a mammogram and is planning scheduling an appointment after this visit.  She has never completed a bone density scan and is interested in scheduling one the same day as her future mammogram.  She denies having any fever, new moles, congestion, sore throat, new muscle pain, new joint pain, chest pain, cough, SOB, wheezing, n/v/d, constipation, blood in stool, dysuria, frequency, hematuria, or headaches at this time. She reports her mother had passed away from dementia.  She does not have a flu vaccine this year and is interested in receiving it during this visit. She reports receiving 1 shingles vaccine 2 years ago and is interested in receiving the second vaccine. She has 2 pfizer Covid-19 vaccines and was informed of the bivalent Covid-19 vaccine. She is UTD on vision care. She is UTD on dental care.     Past Medical History:  Diagnosis Date   Anxiety    Cervical cancer [facility])     Past Surgical History:  Procedure Laterality Date   ABDOMINAL HYSTERECTOMY  2008   COLONOSCOPY  2015   LAPAROSCOPIC RADICAL TOTAL HYSTERECTOMY W/ NODE BIOPSY     still has ovaries   RADIOACTIVE SEED GUIDED EXCISIONAL BREAST BIOPSY Right 04/01/2016   Procedure: RADIOACTIVE SEED GUIDED  EXCISIONAL BREAST BIOPSY;  Surgeon: Rolm Bookbinder, MD;  Location: [facility];  Service: General;  Laterality: Right;    Family History  Problem Relation Age of Onset   Dementia Mother    Prostate cancer Father     Social History   Socioeconomic History   Marital status: Divorced    Spouse name: Not on file   Number of children: Not on file   Years of education: Not on file   Highest education level: Not on file  Occupational History   Occupation: paralegal    Comment: paralegal --- foreclosure  Tobacco Use   Smoking status: Never   Smokeless tobacco: Never  Substance and Sexual Activity   Alcohol use: Yes    Alcohol/week: 0.0 standard drinks   Drug use: No   Sexual activity: Yes    Partners: Male  Other Topics Concern   Not on file  Social History Narrative   Exercising-- walking    Social Determinants of Health   Financial Resource Strain: Not on file  Food Insecurity: Not on file  Transportation Needs: Not on file  Physical Activity: Not on file  Stress: Not on file  Social Connections: Not on file  Intimate Partner Violence: Not on file    Outpatient Medications Prior to Visit  Medication Sig Dispense Refill   BIOTIN PO Take by mouth.     cholecalciferol (VITAMIN D) 1000 units tablet Take 2 tablets (2,000 Units  total) by mouth daily. 90 tablet 3   Fish Oil OIL by Does not apply route.     fluticasone (FLONASE) 50 MCG/ACT nasal spray Place 2 sprays into both nostrils daily. 16 g 6   fluticasone (FLONASE) 50 MCG/ACT nasal spray Place 2 sprays into both nostrils daily. 16 g 0   triamterene-hydrochlorothiazide (MAXZIDE-25) 37.5-25 MG tablet Take 1 tablet by mouth daily. 90 tablet 1   sertraline (ZOLOFT) 50 MG tablet Take 1 tablet (50 mg total) by mouth daily. 30 tablet 0   simvastatin (ZOCOR) 20 MG tablet Take 1 tablet (20 mg total) by mouth at bedtime. 90 tablet 3   trimethoprim (TRIMPEX) 100 MG tablet Take 1 tablet (100 mg total) by mouth  daily. 90 tablet 3   benzonatate (TESSALON) 100 MG capsule Take 1 capsule (100 mg total) by mouth 3 (three) times daily as needed for cough. (Patient not taking: Reported on 03/19/2021) 30 capsule 0   citalopram (CELEXA) 40 MG tablet Take 1 tablet (40 mg total) by mouth daily. (Patient not taking: Reported on 03/19/2021) 90 tablet 0   No facility-administered medications prior to visit.    Allergies  Allergen Reactions   Hydrocodone-Acetaminophen Hives and Rash    Experiences full body rash with Vicodin   Vicodin [Hydrocodone-Acetaminophen] Rash    Review of Systems  Constitutional:  Negative for fever.  HENT:  Negative for congestion and sore throat.   Respiratory:  Negative for cough, shortness of breath and wheezing.   Cardiovascular:  Negative for chest pain.  Gastrointestinal:  Negative for blood in stool, constipation, diarrhea, nausea and vomiting.  Genitourinary:  Negative for dysuria, frequency and hematuria.  Musculoskeletal:  Negative for joint pain and myalgias.  Skin:        (-)New moles  Neurological:  Negative for headaches.  Psychiatric/Behavioral:  The patient is nervous/anxious.       Objective:    Physical Exam Vitals and nursing note reviewed.  Constitutional:      General: She is not in acute distress.    Appearance: Normal appearance. She is not ill-appearing.  HENT:     Head: Normocephalic and atraumatic.     Right Ear: Tympanic membrane, ear canal and external ear normal.     Left Ear: Tympanic membrane, ear canal and external ear normal.  Eyes:     Extraocular Movements: Extraocular movements intact.     Pupils: Pupils are equal, round, and reactive to light.  Cardiovascular:     Rate and Rhythm: Normal rate and regular rhythm.     Heart sounds: Normal heart sounds. No murmur heard.   No gallop.  Pulmonary:     Effort: Pulmonary effort is normal. No respiratory distress.     Breath sounds: Normal breath sounds. No wheezing or rales.  Chest:   Breasts:    Right: Normal. No inverted nipple, mass, nipple discharge, skin change or tenderness.     Left: Normal. No inverted nipple, mass, nipple discharge, skin change or tenderness.  Abdominal:     General: Bowel sounds are normal. There is no distension.     Palpations: Abdomen is soft.     Tenderness: There is no abdominal tenderness. There is no guarding.  Lymphadenopathy:     Upper Body:     Right upper body: No axillary adenopathy.     Left upper body: No axillary adenopathy.  Skin:    General: Skin is warm and dry.  Neurological:     Mental Status: She is  alert and oriented to person, place, and time.  Psychiatric:        Behavior: Behavior normal.    BP 128/80 (BP Location: Left Arm, Patient Position: Sitting, Cuff Size: Normal)    Pulse 62    Temp 98.4 F (36.9 C) (Oral)    Resp 18    Ht 5' 5.5" (1.664 m)    Wt 188 lb 9.6 oz (85.5 kg)    SpO2 98%    BMI 30.91 kg/m  Wt Readings from Last 3 Encounters:  03/19/21 188 lb 9.6 oz (85.5 kg)  05/15/20 186 lb 12.8 oz (84.7 kg)  11/19/19 181 lb (82.1 kg)    Diabetic Foot Exam - Simple   No data filed    Lab Results  Component Value Date   WBC 5.1 12/29/2018   HGB 14.2 12/29/2018   HCT 41.3 12/29/2018   PLT 247 12/29/2018   GLUCOSE 103 (H) 05/15/2020   CHOL 198 05/15/2020   TRIG 118.0 05/15/2020   HDL 50.80 05/15/2020   LDLCALC 124 (H) 05/15/2020   ALT 12 05/15/2020   AST 16 05/15/2020   NA 141 05/15/2020   K 3.7 05/15/2020   CL 102 05/15/2020   CREATININE 0.85 05/15/2020   BUN 17 05/15/2020   CO2 32 05/15/2020   TSH 2.43 05/15/2020   INR 0.98 11/24/2014   MICROALBUR 0.2 04/09/2013    Lab Results  Component Value Date   TSH 2.43 05/15/2020   Lab Results  Component Value Date   WBC 5.1 12/29/2018   HGB 14.2 12/29/2018   HCT 41.3 12/29/2018   MCV 90.0 12/29/2018   PLT 247 12/29/2018   Lab Results  Component Value Date   NA 141 05/15/2020   K 3.7 05/15/2020   CO2 32 05/15/2020   GLUCOSE 103  (H) 05/15/2020   BUN 17 05/15/2020   CREATININE 0.85 05/15/2020   BILITOT 0.3 05/15/2020   ALKPHOS 74 05/15/2020   AST 16 05/15/2020   ALT 12 05/15/2020   PROT 5.9 (L) 05/15/2020   ALBUMIN 3.9 05/15/2020   CALCIUM 9.1 05/15/2020   ANIONGAP 8 03/25/2016   GFR 75.07 05/15/2020   Lab Results  Component Value Date   CHOL 198 05/15/2020   Lab Results  Component Value Date   HDL 50.80 05/15/2020   Lab Results  Component Value Date   LDLCALC 124 (H) 05/15/2020   Lab Results  Component Value Date   TRIG 118.0 05/15/2020   Lab Results  Component Value Date   CHOLHDL 4 05/15/2020   No results found for: HGBA1C  Mammogram- Last completed 02/18/2016 Colonoscopy- Last completed 07/13/2013. Results showed diverticulosis in sigmoid colon, otherwise results are normal.      Assessment & Plan:   Problem List Items Addressed This Visit       Unprioritized   Anxiety   Relevant Medications   citalopram (CELEXA) 20 MG tablet   buPROPion (WELLBUTRIN XL) 150 MG 24 hr tablet   Depression with anxiety    D/c zoloft Restart celexa and wellbutrin  F/u 1 month or sooner prn      Relevant Medications   citalopram (CELEXA) 20 MG tablet   buPROPion (WELLBUTRIN XL) 150 MG 24 hr tablet   Fall    Both were accidents--- slipping on wet surfaces with no injuries       Hyperlipidemia LDL goal <100    Tolerating statin, encouraged heart healthy diet, avoid trans fats, minimize simple carbs and saturated fats. Increase exercise as  tolerated      Relevant Medications   simvastatin (ZOCOR) 20 MG tablet   Preventative health care - Primary    ghm utd Check labs s See avs      Relevant Orders   Comprehensive metabolic panel   CBC with Differential/Platelet   Lipid panel   TSH   Primary hypertension    Well controlled, no changes to meds. Encouraged heart healthy diet such as the DASH diet and exercise as tolerated.       Relevant Medications   simvastatin (ZOCOR) 20 MG tablet    Other Relevant Orders   Comprehensive metabolic panel   CBC with Differential/Platelet   Lipid panel   TSH   Recurrent UTI    con't daily prophylactic       Relevant Medications   trimethoprim (TRIMPEX) 100 MG tablet   Other Visit Diagnoses     Hyperlipidemia, unspecified hyperlipidemia type       Relevant Medications   simvastatin (ZOCOR) 20 MG tablet   Other Relevant Orders   Comprehensive metabolic panel   CBC with Differential/Platelet   Lipid panel   TSH   Need for influenza vaccination       Relevant Orders   Flu Vaccine QUAD 69mo+IM (Fluarix, Fluzone & Alfiuria Quad PF)   Need for shingles vaccine       Relevant Orders   Varicella-zoster vaccine IM   Estrogen deficiency       Relevant Orders   DG Bone Density        Meds ordered this encounter  Medications   simvastatin (ZOCOR) 20 MG tablet    Sig: Take 1 tablet (20 mg total) by mouth at bedtime.    Dispense:  90 tablet    Refill:  3   citalopram (CELEXA) 20 MG tablet    Sig: Take 1 tablet (20 mg total) by mouth daily.    Dispense:  90 tablet    Refill:  1   buPROPion (WELLBUTRIN XL) 150 MG 24 hr tablet    Sig: Take 1 tablet (150 mg total) by mouth daily.    Dispense:  90 tablet    Refill:  1   trimethoprim (TRIMPEX) 100 MG tablet    Sig: Take 1 tablet (100 mg total) by mouth daily.    Dispense:  90 tablet    Refill:  3    I, Dr. Roma Schanz, DO, personally preformed the services described in this documentation.  All medical record entries made by the scribe were at my direction and in my presence.  I have reviewed the chart and discharge instructions (if applicable) and agree that the record reflects my personal performance and is accurate and complete. 03/19/2021   I,[name],acting as a scribe for Ann Held, DO.,have documented all relevant documentation on the behalf of Ann Held, DO,as directed by  Ann Held, DO while in the presence of Ann Held, DO.   Ann Held, DO

## 2021-03-19 NOTE — Assessment & Plan Note (Signed)
Both were accidents--- slipping on wet surfaces with no injuries

## 2021-03-19 NOTE — Assessment & Plan Note (Signed)
ghm utd Check labs s See avs

## 2021-03-19 NOTE — Assessment & Plan Note (Signed)
con't daily prophylactic

## 2021-03-19 NOTE — Patient Instructions (Signed)

## 2021-03-19 NOTE — Assessment & Plan Note (Signed)
Tolerating statin, encouraged heart healthy diet, avoid trans fats, minimize simple carbs and saturated fats. Increase exercise as tolerated 

## 2021-03-19 NOTE — Assessment & Plan Note (Signed)
Well controlled, no changes to meds. Encouraged heart healthy diet such as the DASH diet and exercise as tolerated.  °

## 2021-03-20 LAB — COMPREHENSIVE METABOLIC PANEL
ALT: 16 U/L (ref 0–35)
AST: 19 U/L (ref 0–37)
Albumin: 4.3 g/dL (ref 3.5–5.2)
Alkaline Phosphatase: 77 U/L (ref 39–117)
BUN: 18 mg/dL (ref 6–23)
CO2: 33 mEq/L — ABNORMAL HIGH (ref 19–32)
Calcium: 9.2 mg/dL (ref 8.4–10.5)
Chloride: 100 mEq/L (ref 96–112)
Creatinine, Ser: 0.95 mg/dL (ref 0.40–1.20)
GFR: 65.3 mL/min (ref >60.00)
Glucose, Bld: 78 mg/dL (ref 70–99)
Potassium: 4 mEq/L (ref 3.5–5.1)
Sodium: 139 mEq/L (ref 135–145)
Total Bilirubin: 0.4 mg/dL (ref 0.2–1.2)
Total Protein: 6.5 g/dL (ref 6.0–8.3)

## 2021-03-20 LAB — LIPID PANEL
Cholesterol: 175 mg/dL (ref 0–200)
HDL: 52.8 mg/dL (ref >39.00)
LDL Cholesterol: 102 mg/dL — ABNORMAL HIGH (ref 0–99)
NonHDL: 121.78
Total CHOL/HDL Ratio: 3
Triglycerides: 99 mg/dL (ref 0.0–149.0)
VLDL: 19.8 mg/dL (ref 0.0–40.0)

## 2021-03-20 LAB — CBC WITH DIFFERENTIAL/PLATELET
Basophils Absolute: 0.1 10*3/uL (ref 0.0–0.1)
Basophils Relative: 1.1 % (ref 0.0–3.0)
Eosinophils Absolute: 0.2 10*3/uL (ref 0.0–0.7)
Eosinophils Relative: 3.8 % (ref 0.0–5.0)
HCT: 39.8 % (ref 36.0–46.0)
Hemoglobin: 13.3 g/dL (ref 12.0–15.0)
Lymphocytes Relative: 39.9 % (ref 12.0–46.0)
Lymphs Abs: 2.2 10*3/uL (ref 0.7–4.0)
MCHC: 33.5 g/dL (ref 30.0–36.0)
MCV: 92.2 fl (ref 78.0–100.0)
Monocytes Absolute: 0.4 10*3/uL (ref 0.1–1.0)
Monocytes Relative: 7.7 % (ref 3.0–12.0)
Neutro Abs: 2.6 10*3/uL (ref 1.4–7.7)
Neutrophils Relative %: 47.5 % (ref 43.0–77.0)
Platelets: 215 10*3/uL (ref 150.0–400.0)
RBC: 4.31 Mil/uL (ref 3.87–5.11)
RDW: 13.2 % (ref 11.5–15.5)
WBC: 5.5 10*3/uL (ref 4.0–10.5)

## 2021-03-20 LAB — TSH: TSH: 4.75 u[IU]/mL (ref 0.35–5.50)

## 2021-03-25 ENCOUNTER — Encounter: Payer: Self-pay | Admitting: Family Medicine

## 2021-03-25 DIAGNOSIS — R600 Localized edema: Secondary | ICD-10-CM

## 2021-03-25 MED ORDER — TRIAMTERENE-HCTZ 37.5-25 MG PO TABS: 1.0000 | ORAL_TABLET | Freq: Every day | ORAL | 1 refills | Status: DC

## 2021-04-16 ENCOUNTER — Telehealth (INDEPENDENT_AMBULATORY_CARE_PROVIDER_SITE_OTHER): Payer: BC Managed Care – PPO | Admitting: Family Medicine

## 2021-04-16 ENCOUNTER — Encounter: Payer: Self-pay | Admitting: Family Medicine

## 2021-04-16 DIAGNOSIS — F418 Other specified anxiety disorders: Secondary | ICD-10-CM

## 2021-04-16 NOTE — Progress Notes (Signed)
MyChart Video Visit    Virtual Visit via Video Note   This visit type was conducted due to national recommendations for restrictions regarding the COVID-19 Pandemic (e.g. social distancing) in an effort to limit this patient's exposure and mitigate transmission in our community. This patient is at least at moderate risk for complications without adequate follow up. This format is felt to be most appropriate for this patient at this time. Physical exam was limited by quality of the video and audio technology used for the visit. Alinda Dooms was able to get the patient set up on a video visit.  Patient location: Home Patient and provider in visit Provider location: Office  I discussed the limitations of evaluation and management by telemedicine and the availability of in person appointments. The patient expressed understanding and agreed to proceed.  Visit Date: 04/16/2021  Today's healthcare provider: Ann Held, DO     Subjective:    Patient ID: [name], female    DOB: Apr 02, 1960, 61 y.o.   MRN: [id number]  Chief Complaint  Patient presents with   Depression   Anxiety   Follow-up    HPI Patient is in today for a video visit.  She reports doing well while taking 150 mg Wellbutrin XR daily PO. She is not interested in increasing the dose. She continues taking 20 mg celexa daily PO and reports no new issues while taking it.   Past Medical History:  Diagnosis Date   Anxiety    Cervical cancer [facility])     Past Surgical History:  Procedure Laterality Date   ABDOMINAL HYSTERECTOMY  2008   COLONOSCOPY  2015   LAPAROSCOPIC RADICAL TOTAL HYSTERECTOMY W/ NODE BIOPSY     still has ovaries   RADIOACTIVE SEED GUIDED EXCISIONAL BREAST BIOPSY Right 04/01/2016   Procedure: RADIOACTIVE SEED GUIDED EXCISIONAL BREAST BIOPSY;  Surgeon: Rolm Bookbinder, MD;  Location: [place];  Service: General;  Laterality: Right;    Family History  Problem  Relation Age of Onset   Dementia Mother    Prostate cancer Father     Social History   Socioeconomic History   Marital status: Divorced    Spouse name: Not on file   Number of children: Not on file   Years of education: Not on file   Highest education level: Not on file  Occupational History   Occupation: paralegal    Comment: paralegal --- foreclosure  Tobacco Use   Smoking status: Never   Smokeless tobacco: Never  Substance and Sexual Activity   Alcohol use: Yes    Alcohol/week: 0.0 standard drinks   Drug use: No   Sexual activity: Yes    Partners: Male  Other Topics Concern   Not on file  Social History Narrative   Exercising-- walking    Social Determinants of Health   Financial Resource Strain: Not on file  Food Insecurity: Not on file  Transportation Needs: Not on file  Physical Activity: Not on file  Stress: Not on file  Social Connections: Not on file  Intimate Partner Violence: Not on file    Outpatient Medications Prior to Visit  Medication Sig Dispense Refill   BIOTIN PO Take by mouth.     buPROPion (WELLBUTRIN XL) 150 MG 24 hr tablet Take 1 tablet (150 mg total) by mouth daily. 90 tablet 1   cholecalciferol (VITAMIN D) 1000 units tablet Take 2 tablets (2,000 Units total) by mouth daily. 90 tablet 3   citalopram (CELEXA)  20 MG tablet Take 1 tablet (20 mg total) by mouth daily. 90 tablet 1   Fish Oil OIL by Does not apply route.     fluticasone (FLONASE) 50 MCG/ACT nasal spray Place 2 sprays into both nostrils daily. 16 g 6   fluticasone (FLONASE) 50 MCG/ACT nasal spray Place 2 sprays into both nostrils daily. 16 g 0   simvastatin (ZOCOR) 20 MG tablet Take 1 tablet (20 mg total) by mouth at bedtime. 90 tablet 3   triamterene-hydrochlorothiazide (MAXZIDE-25) 37.5-25 MG tablet Take 1 tablet by mouth daily. 90 tablet 1   trimethoprim (TRIMPEX) 100 MG tablet Take 1 tablet (100 mg total) by mouth daily. 90 tablet 3   No facility-administered medications  prior to visit.    Allergies  Allergen Reactions   Hydrocodone-Acetaminophen Hives and Rash    Experiences full body rash with Vicodin   Vicodin [Hydrocodone-Acetaminophen] Rash    Review of Systems  Constitutional:  Negative for fever and malaise/fatigue.  HENT:  Negative for congestion.   Eyes:  Negative for blurred vision.  Respiratory:  Negative for shortness of breath.   Cardiovascular:  Negative for chest pain, palpitations and leg swelling.  Gastrointestinal:  Negative for abdominal pain, blood in stool and nausea.  Genitourinary:  Negative for dysuria and frequency.  Musculoskeletal:  Negative for falls.  Skin:  Negative for rash.  Neurological:  Negative for dizziness, loss of consciousness and headaches.  Endo/Heme/Allergies:  Negative for environmental allergies.  Psychiatric/Behavioral:  Negative for depression and suicidal ideas. The patient is not nervous/anxious.       Objective:    Physical Exam Vitals and nursing note reviewed.  Psychiatric:        Mood and Affect: Mood normal.        Behavior: Behavior normal.        Thought Content: Thought content normal.        Judgment: Judgment normal.    There were no vitals taken for this visit. Wt Readings from Last 3 Encounters:  03/19/21 188 lb 9.6 oz (85.5 kg)  05/15/20 186 lb 12.8 oz (84.7 kg)  11/19/19 181 lb (82.1 kg)    Diabetic Foot Exam - Simple   No data filed    Lab Results  Component Value Date   WBC 5.5 03/19/2021   HGB 13.3 03/19/2021   HCT 39.8 03/19/2021   PLT 215.0 03/19/2021   GLUCOSE 78 03/19/2021   CHOL 175 03/19/2021   TRIG 99.0 03/19/2021   HDL 52.80 03/19/2021   LDLCALC 102 (H) 03/19/2021   ALT 16 03/19/2021   AST 19 03/19/2021   NA 139 03/19/2021   K 4.0 03/19/2021   CL 100 03/19/2021   CREATININE 0.95 03/19/2021   BUN 18 03/19/2021   CO2 33 (H) 03/19/2021   TSH 4.75 03/19/2021   INR 0.98 11/24/2014   MICROALBUR 0.2 04/09/2013    Lab Results  Component Value Date    TSH 4.75 03/19/2021   Lab Results  Component Value Date   WBC 5.5 03/19/2021   HGB 13.3 03/19/2021   HCT 39.8 03/19/2021   MCV 92.2 03/19/2021   PLT 215.0 03/19/2021   Lab Results  Component Value Date   NA 139 03/19/2021   K 4.0 03/19/2021   CO2 33 (H) 03/19/2021   GLUCOSE 78 03/19/2021   BUN 18 03/19/2021   CREATININE 0.95 03/19/2021   BILITOT 0.4 03/19/2021   ALKPHOS 77 03/19/2021   AST 19 03/19/2021   ALT 16 03/19/2021  PROT 6.5 03/19/2021   ALBUMIN 4.3 03/19/2021   CALCIUM 9.2 03/19/2021   ANIONGAP 8 03/25/2016   GFR 65.30 03/19/2021   Lab Results  Component Value Date   CHOL 175 03/19/2021   Lab Results  Component Value Date   HDL 52.80 03/19/2021   Lab Results  Component Value Date   LDLCALC 102 (H) 03/19/2021   Lab Results  Component Value Date   TRIG 99.0 03/19/2021   Lab Results  Component Value Date   CHOLHDL 3 03/19/2021   No results found for: HGBA1C     Assessment & Plan:   Problem List Items Addressed This Visit   None    No orders of the defined types were placed in this encounter.   I discussed the assessment and treatment plan with the patient. The patient was provided an opportunity to ask questions and all were answered. The patient agreed with the plan and demonstrated an understanding of the instructions.   The patient was advised to call back or seek an in-person evaluation if the symptoms worsen or if the condition fails to improve as anticipated.  I,[name],acting as a Education administrator for [employer], DO.,have documented all relevant documentation on the behalf of Ann Held, DO,as directed by  Ann Held, DO while in the presence of Ann Held, DO.  I provided 20 minutes of face-to-face time during this encounter.   Ann Held, DO [place] [facility] [phone] (phone) [phone] (fax)  [place]

## 2021-06-29 ENCOUNTER — Other Ambulatory Visit: Payer: Self-pay | Admitting: Family Medicine

## 2021-06-29 DIAGNOSIS — F418 Other specified anxiety disorders: Secondary | ICD-10-CM

## 2021-06-29 DIAGNOSIS — R600 Localized edema: Secondary | ICD-10-CM

## 2021-09-16 ENCOUNTER — Telehealth: Payer: Self-pay | Admitting: *Deleted

## 2021-09-16 NOTE — Telephone Encounter (Signed)
Patient had a shingles vaccine in 2020 and one in January 2023.  Patient does not need another vaccine.  Appointment canceled for vaccine.

## 2021-09-17 ENCOUNTER — Ambulatory Visit: Payer: BC Managed Care – PPO | Admitting: Family Medicine

## 2021-09-17 ENCOUNTER — Ambulatory Visit: Payer: BC Managed Care – PPO

## 2021-09-24 DIAGNOSIS — M25551 Pain in right hip: Secondary | ICD-10-CM | POA: Diagnosis not present

## 2021-09-29 ENCOUNTER — Ambulatory Visit (HOSPITAL_BASED_OUTPATIENT_CLINIC_OR_DEPARTMENT_OTHER)
Admission: RE | Admit: 2021-09-29 | Discharge: 2021-09-29 | Disposition: A | Discharge status: Home / Self Care | Payer: BC Managed Care – PPO | Source: Ambulatory Visit | Attending: Family Medicine | Admitting: Family Medicine

## 2021-09-29 ENCOUNTER — Ambulatory Visit: Payer: BC Managed Care – PPO | Admitting: Family Medicine

## 2021-09-29 ENCOUNTER — Encounter: Payer: Self-pay | Admitting: Family Medicine

## 2021-09-29 VITALS — BP 118/78 | HR 64 | Temp 98.1°F | Resp 18 | Ht 65.5 in | Wt 184.2 lb

## 2021-09-29 DIAGNOSIS — M5441 Lumbago with sciatica, right side: Secondary | ICD-10-CM | POA: Insufficient documentation

## 2021-09-29 DIAGNOSIS — M25551 Pain in right hip: Secondary | ICD-10-CM | POA: Diagnosis not present

## 2021-09-29 DIAGNOSIS — F418 Other specified anxiety disorders: Secondary | ICD-10-CM

## 2021-09-29 DIAGNOSIS — F419 Anxiety disorder, unspecified: Secondary | ICD-10-CM

## 2021-09-29 DIAGNOSIS — R600 Localized edema: Secondary | ICD-10-CM

## 2021-09-29 DIAGNOSIS — M545 Low back pain, unspecified: Secondary | ICD-10-CM | POA: Diagnosis not present

## 2021-09-29 MED ORDER — TRIAMTERENE-HCTZ 37.5-25 MG PO TABS: 1.0000 | ORAL_TABLET | Freq: Every day | ORAL | 3 refills | Status: DC

## 2021-09-29 MED ORDER — CITALOPRAM HYDROBROMIDE 40 MG PO TABS: 40.0000 mg | ORAL_TABLET | Freq: Every day | ORAL | 3 refills | Status: DC

## 2021-09-29 MED ORDER — CYCLOBENZAPRINE HCL 10 MG PO TABS: 10.0000 mg | ORAL_TABLET | Freq: Three times a day (TID) | ORAL | 0 refills | Status: DC | PRN

## 2021-09-29 MED ORDER — BUPROPION HCL ER (XL) 150 MG PO TB24: 150.0000 mg | ORAL_TABLET | Freq: Every day | ORAL | 3 refills | Status: DC

## 2021-09-29 NOTE — Assessment & Plan Note (Signed)
Inc celexa 40 mg daily  con't wellbutrin  F/u cpe

## 2021-09-29 NOTE — Progress Notes (Signed)
6  Subjective:   By signing my name below, I, [name], attest that this documentation has been prepared under the direction and in the presence of Ann Held, DO 09/29/2021   Patient ID: [name], female    DOB: 02-Jul-1960, 61 y.o.   MRN: [id number]  Chief Complaint  Patient presents with   Depression   Follow-up    HPI Patient is in today for follow up visit.  She has been experiencing back pain which she has been managing with prednisone. She reports that her back pain radiates down her lower extremity. She says that her pain had improved prior to the visit. 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   She complains of having more difficulty managing her anxiety than her depression.   She remains compliant with her Wellbutrin and other medication with no adverse effects.   She has been stressed recently due to working at her new job, and taking care of her mother.   Past Medical History:  Diagnosis Date   Anxiety    Cervical cancer [facility])     Past Surgical History:  Procedure Laterality Date   ABDOMINAL HYSTERECTOMY  2008   COLONOSCOPY  2015   LAPAROSCOPIC RADICAL TOTAL HYSTERECTOMY W/ NODE BIOPSY     still has ovaries   RADIOACTIVE SEED GUIDED EXCISIONAL BREAST BIOPSY Right 04/01/2016   Procedure: RADIOACTIVE SEED GUIDED EXCISIONAL BREAST BIOPSY;  Surgeon: Rolm Bookbinder, MD;  Location: [facility];  Service: General;  Laterality: Right;    Family History  Problem Relation Age of Onset   Dementia Mother    Prostate cancer Father     Social History   Socioeconomic History   Marital status: Divorced    Spouse name: Not on file   Number of children: Not on file   Years of education: Not on file   Highest education level: Not on file  Occupational History   Occupation: paralegal    Comment: paralegal --- foreclosure  Tobacco Use   Smoking  status: Never   Smokeless tobacco: Never  Substance and Sexual Activity   Alcohol use: Yes    Alcohol/week: 0.0 standard drinks of alcohol   Drug use: No   Sexual activity: Yes    Partners: Male  Other Topics Concern   Not on file  Social History Narrative   Exercising-- walking    Social Determinants of Health   Financial Resource Strain: Not on file  Food Insecurity: Not on file  Transportation Needs: Not on file  Physical Activity: Not on file  Stress: Not on file  Social Connections: Not on file  Intimate Partner Violence: Not on file    Outpatient Medications Prior to Visit  Medication Sig Dispense Refill   BIOTIN PO Take by mouth.     cholecalciferol (VITAMIN D) 1000 units tablet Take 2 tablets (2,000 Units total) by mouth daily. 90 tablet 3   Fish Oil OIL by Does not apply route.     fluticasone (FLONASE) 50 MCG/ACT nasal spray Place 2 sprays into both nostrils daily. 16 g 6   fluticasone (FLONASE) 50 MCG/ACT nasal spray Place 2 sprays into both nostrils daily. 16 g 0   simvastatin (ZOCOR) 20 MG tablet Take 1 tablet (20 mg total) by mouth at bedtime. 90 tablet 3   trimethoprim (TRIMPEX) 100 MG tablet Take 1 tablet (100 mg total) by mouth daily. [place]  tablet 3   buPROPion (WELLBUTRIN XL) 150 MG 24 hr tablet Take 1 tablet by mouth once daily 90 tablet 0   citalopram (CELEXA) 20 MG tablet Take 1 tablet by mouth once daily 90 tablet 0   triamterene-hydrochlorothiazide (MAXZIDE-25) 37.5-25 MG tablet Take 1 tablet by mouth once daily 90 tablet 0   No facility-administered medications prior to visit.    Allergies  Allergen Reactions   Hydrocodone-Acetaminophen Hives and Rash    Experiences full body rash with Vicodin   Vicodin [Hydrocodone-Acetaminophen] Rash    Review of Systems  Constitutional:  Negative for fever and malaise/fatigue.  HENT:  Negative for congestion.   Eyes:  Negative for blurred vision.  Respiratory:  Negative for shortness of breath.    Cardiovascular:  Negative for chest pain, palpitations and leg swelling.  Gastrointestinal:  Negative for abdominal pain, blood in stool and nausea.  Genitourinary:  Negative for dysuria and frequency.  Musculoskeletal:  Positive for back pain. Negative for falls.  Skin:  Negative for rash.  Neurological:  Negative for dizziness, loss of consciousness and headaches.  Endo/Heme/Allergies:  Negative for environmental allergies.  Psychiatric/Behavioral:  Positive for depression. The patient is nervous/anxious.        Objective:    Physical Exam Vitals and nursing note reviewed.  Constitutional:      Appearance: Normal appearance. She is not ill-appearing.  HENT:     Head: Normocephalic and atraumatic.     Right Ear: External ear normal.     Left Ear: External ear normal.  Eyes:     Extraocular Movements: Extraocular movements intact.     Pupils: Pupils are equal, round, and reactive to light.  Cardiovascular:     Rate and Rhythm: Normal rate and regular rhythm.     Pulses: Normal pulses.     Heart sounds: Normal heart sounds. No murmur heard.    No gallop.  Pulmonary:     Effort: Pulmonary effort is normal. No respiratory distress.     Breath sounds: Normal breath sounds. No wheezing or rales.  Musculoskeletal:     Comments: (+) knee pain when she bends down  (+) Back pain with flexion and extension  Skin:    General: Skin is warm and dry.  Neurological:     Mental Status: She is alert and oriented to person, place, and time.  Psychiatric:        Judgment: Judgment normal.     BP 118/78 (BP Location: Right Arm, Patient Position: Sitting, Cuff Size: Large)   Pulse 64   Temp 98.1 F (36.7 C) (Oral)   Resp 18   Ht 5' 5.5" (1.664 m)   Wt 184 lb 3.2 oz (83.6 kg)   SpO2 98%   BMI 30.19 kg/m  Wt Readings from Last 3 Encounters:  09/29/21 184 lb 3.2 oz (83.6 kg)  03/19/21 188 lb 9.6 oz (85.5 kg)  05/15/20 186 lb 12.8 oz (84.7 kg)    Diabetic Foot Exam - Simple   No  data filed    Lab Results  Component Value Date   WBC 5.5 03/19/2021   HGB 13.3 03/19/2021   HCT 39.8 03/19/2021   PLT 215.0 03/19/2021   GLUCOSE 78 03/19/2021   CHOL 175 03/19/2021   TRIG 99.0 03/19/2021   HDL 52.80 03/19/2021   LDLCALC 102 (H) 03/19/2021   ALT 16 03/19/2021   AST 19 03/19/2021   NA 139 03/19/2021   K 4.0 03/19/2021   CL 100 03/19/2021  CREATININE 0.95 03/19/2021   BUN 18 03/19/2021   CO2 33 (H) 03/19/2021   TSH 4.75 03/19/2021   INR 0.98 11/24/2014   MICROALBUR 0.2 04/09/2013    Lab Results  Component Value Date   TSH 4.75 03/19/2021   Lab Results  Component Value Date   WBC 5.5 03/19/2021   HGB 13.3 03/19/2021   HCT 39.8 03/19/2021   MCV 92.2 03/19/2021   PLT 215.0 03/19/2021   Lab Results  Component Value Date   NA 139 03/19/2021   K 4.0 03/19/2021   CO2 33 (H) 03/19/2021   GLUCOSE 78 03/19/2021   BUN 18 03/19/2021   CREATININE 0.95 03/19/2021   BILITOT 0.4 03/19/2021   ALKPHOS 77 03/19/2021   AST 19 03/19/2021   ALT 16 03/19/2021   PROT 6.5 03/19/2021   ALBUMIN 4.3 03/19/2021   CALCIUM 9.2 03/19/2021   ANIONGAP 8 03/25/2016   GFR 65.30 03/19/2021   Lab Results  Component Value Date   CHOL 175 03/19/2021   Lab Results  Component Value Date   HDL 52.80 03/19/2021   Lab Results  Component Value Date   LDLCALC 102 (H) 03/19/2021   Lab Results  Component Value Date   TRIG 99.0 03/19/2021   Lab Results  Component Value Date   CHOLHDL 3 03/19/2021   No results found for: "HGBA1C"     Assessment & Plan:   Problem List Items Addressed This Visit       Unprioritized   Edema of extremities   Relevant Medications   triamterene-hydrochlorothiazide (MAXZIDE-25) 37.5-25 MG tablet   Depression with anxiety   Relevant Medications   buPROPion (WELLBUTRIN XL) 150 MG 24 hr tablet   citalopram (CELEXA) 40 MG tablet   Anxiety    Inc celexa 40 mg daily  con't wellbutrin  F/u cpe      Relevant Medications   buPROPion  (WELLBUTRIN XL) 150 MG 24 hr tablet   citalopram (CELEXA) 40 MG tablet   Other Visit Diagnoses     Acute right-sided low back pain with right-sided sciatica    -  Primary   Relevant Medications   buPROPion (WELLBUTRIN XL) 150 MG 24 hr tablet   citalopram (CELEXA) 40 MG tablet   cyclobenzaprine (FLEXERIL) 10 MG tablet   Other Relevant Orders   DG Lumbar Spine Complete (Completed)        Meds ordered this encounter  Medications   buPROPion (WELLBUTRIN XL) 150 MG 24 hr tablet    Sig: Take 1 tablet (150 mg total) by mouth daily.    Dispense:  90 tablet    Refill:  3   triamterene-hydrochlorothiazide (MAXZIDE-25) 37.5-25 MG tablet    Sig: Take 1 tablet by mouth daily.    Dispense:  90 tablet    Refill:  3   citalopram (CELEXA) 40 MG tablet    Sig: Take 1 tablet (40 mg total) by mouth daily.    Dispense:  90 tablet    Refill:  3   cyclobenzaprine (FLEXERIL) 10 MG tablet    Sig: Take 1 tablet (10 mg total) by mouth 3 (three) times daily as needed for muscle spasms.    Dispense:  30 tablet    Refill:  0    I, Ann Held, DO, personally preformed the services described in this documentation.  All medical record entries made by the scribe were at my direction and in my presence.  I have reviewed the chart and discharge instructions (if applicable)  and agree that the record reflects my personal performance and is accurate and complete. 09/29/2021   I,Tinashe Williams,acting as a scribe for Ann Held, DO.,have documented all relevant documentation on the behalf of Ann Held, DO,as directed by  Ann Held, DO while in the presence of Ann Held, DO.    Ann Held, DO

## 2021-09-29 NOTE — Patient Instructions (Signed)
Major Depressive Disorder, Adult Major depressive disorder (MDD) is a mental health condition. It may also be called clinical depression or unipolar depression. MDD causes symptoms of sadness, hopelessness, and loss of interest in things. These symptoms last most of the day, almost every day, for 2 weeks. MDD can also cause physical symptoms. It can interfere with relationships and with everyday activities, such as work, school, and activities that are usually pleasant. MDD may be mild, moderate, or severe. It may be single-episode MDD, which happens once, or recurrent MDD, which may occur multiple times. What are the causes? The exact cause of this condition is not known. MDD is most likely caused by a combination of things, which may include: Your personality traits. Learned or conditioned behaviors or thoughts or feelings that reinforce negativity. Any alcohol or substance misuse. Long-term (chronic) physical or mental health illness. Going through a traumatic experience or major life changes. What increases the risk? The following factors may make someone more likely to develop MDD: A family history of depression. Being a woman. Troubled family relationships. Abnormally low levels of certain brain chemicals. Traumatic or painful events in childhood, especially abuse or loss of a parent. A lot of stress from life experiences, such as poor living conditions or discrimination. Chronic physical illness or other mental health disorders. What are the signs or symptoms? The main symptoms of MDD usually include: Constant depressed or irritable mood. A loss of interest in things and activities. Other symptoms include: Sleeping or eating too much or too little. Unexplained weight gain or weight loss. Tiredness or low energy. Being agitated, restless, or weak. Feeling hopeless, worthless, or guilty. Trouble thinking clearly or making decisions. Thoughts of suicide or thoughts of harming  others. Isolating oneself or avoiding other people or activities. Trouble completing tasks, work, or any normal obligations. Severe symptoms of this condition may include: Psychotic depression.This may include false beliefs, or delusions. It may also include seeing, hearing, tasting, smelling, or feeling things that are not real (hallucinations). Chronic depression or persistent depressive disorder. This is low-level depression that lasts for at least 2 years. Melancholic depression, or feeling extremely sad and hopeless. Catatonic depression, which includes trouble speaking and trouble moving. How is this diagnosed? This condition may be diagnosed based on: Your symptoms. Your medical and mental health history. You may be asked questions about your lifestyle, including any drug and alcohol use. A physical exam. Blood tests to rule out other conditions. MDD is confirmed if you have the following symptoms most of the day, nearly every day, in a 2-week period: Either a depressed mood or loss of interest. At least four other MDD symptoms. How is this treated? This condition is usually treated by mental health professionals, such as psychologists, psychiatrists, and clinical social workers. You may need more than one type of treatment. Treatment may include: Psychotherapy, also called talk therapy or counseling. Types of psychotherapy include: Cognitive behavioral therapy (CBT). This teaches you to recognize unhealthy feelings, thoughts, and behaviors, and replace them with positive thoughts and actions. Interpersonal therapy (IPT). This helps you to improve the way you communicate with others or relate to them. Family therapy. This treatment includes members of your family. Medicines to treat anxiety and depression. These medicines help to balance the brain chemicals that affect your emotions. Lifestyle changes. You may be asked to: Limit alcohol use and avoid drug use. Get regular  exercise. Get plenty of sleep. Make healthy eating choices. Spend more time outdoors. Brain stimulation. This may  be done if symptoms are very severe and other treatments have not worked. Examples of this treatment are electroconvulsive therapy and transcranial magnetic stimulation. Follow these instructions at home: Activity Exercise regularly and spend time outdoors. Find activities that you enjoy doing, and make time to do them. Find healthy ways to manage stress, such as: Meditation or deep breathing. Spending time in nature. Journaling. Return to your normal activities as told by your health care provider. Ask your health care provider what activities are safe for you. Alcohol and drug use If you drink alcohol: Limit how much you use to: 0-1 drink a day for women who are not pregnant. 0-2 drinks a day for men. Be aware of how much alcohol is in your drink. In the [place] one drink equals one 12 oz bottle of beer (355 mL), one 5 oz glass of wine (148 mL), or one 1 oz glass of hard liquor (44 mL). Discuss your alcohol use with your health care provider. Alcohol can affect any antidepressant medicines you are taking. Discuss any drug use with your health care provider. General instructions  Take over-the-counter and prescription medicines only as told by your health care provider. Eat a healthy diet and get plenty of sleep. Consider joining a support group. Your health care provider may be able to recommend one. Keep all follow-up visits as told by your health care provider. This is important. Where to find more information [employer] on Mental Illness: www.nami.[place]: https://carter.com/ Contact a health care provider if: Your symptoms get worse. You develop new symptoms. Get help right away if: You self-harm. You have serious thoughts about hurting yourself or others. You hallucinate. If you ever feel like you may hurt yourself or  others, or have thoughts about taking your own life, get help right away. Go to your nearest emergency department or: Call your local emergency services (911 in the [place] Call a suicide crisis helpline, such as the [place] at (828)883-4651 or 988 in the [place]. This is open 24 hours a day in the [place] Text the Crisis Text Line at (680)133-4337 (in the [place].). Summary Major depressive disorder (MDD) is a mental health condition. MDD causes symptoms of sadness, hopelessness, and loss of interest in things. These symptoms last most of the day, almost every day, for 2 weeks. The symptoms of MDD can interfere with relationships and with everyday activities. Treatments and support are available for people who develop MDD. You may need more than one type of treatment. Get help right away if you have serious thoughts about hurting yourself or others. This information is not intended to replace advice given to you by your health care provider. Make sure you discuss any questions you have with your health care provider. Document Revised: 09/10/2020 Document Reviewed: 01/27/2019 Elsevier Patient Education  [place].

## 2021-10-05 DIAGNOSIS — M533 Sacrococcygeal disorders, not elsewhere classified: Secondary | ICD-10-CM | POA: Diagnosis not present

## 2021-10-21 DIAGNOSIS — M533 Sacrococcygeal disorders, not elsewhere classified: Secondary | ICD-10-CM | POA: Diagnosis not present

## 2022-03-12 DIAGNOSIS — H524 Presbyopia: Secondary | ICD-10-CM | POA: Diagnosis not present

## 2022-10-13 ENCOUNTER — Telehealth: Payer: Self-pay

## 2022-10-13 NOTE — Telephone Encounter (Signed)
Patient Name [name] Gender: Female DOB: 04-07-1960 Age: 62 Y 8 M 5 D [id number] Client [facility] - Client Client Site [facility] - Day Provider Seabron Spates- MD Contact Type Call Who Is Calling Patient / Member / Family / Caregiver Call Type Triage / Clinical Relationship To Patient Self Return Phone Number [phone] (Primary) Chief Complaint Dizziness Reason for Call Symptomatic / Request for Health Information Initial Comment [name] states she is experiencing dizziness. Translation No No Triage Reason Patient declined Nurse Assessment Nurse: Suezanne Jacquet, RN, Riley Lam Date/Time Lamount Cohen Time): 10/12/2022 12:53:56 PM Confirm and document reason for call. If symptomatic, describe symptoms. ---c/o dizziness going from a sitting to standing position and happened again a few weeks ago. When out in town and got so dizzy almost couldn't stand up. Its not every day that it happens and not going on now. Does the patient have any new or worsening symptoms? ---Yes Will a triage be completed? ---No Select reason for no triage. ---Patient declined Please document clinical information provided and list any resource used. ---[name] states she is at lunch with a friend and prefers to call back at a later time to discuss. Symptoms are not going on today. Disp. Time Lamount Cohen Time) Disposition Final User 10/12/2022 12:58:02 PM Clinical Call Yes Suezanne Jacquet, RN, Riley Lam Final Disposition 10/12/2022 12:58:02 PM Clinical Call Yes Suezanne Jacquet, RN, Riley Lam

## 2022-10-13 NOTE — Telephone Encounter (Signed)
Pt needs appointment please 

## 2022-10-18 NOTE — Telephone Encounter (Signed)
Pt was called and they advised that she was not actively experiencing dizziness and she said that was only happening once every couple of months. Pt stated she wanted to scheduled her CPE and pt was scheduled for that appt.

## 2022-10-22 ENCOUNTER — Encounter: Payer: Self-pay | Admitting: Family Medicine

## 2022-10-22 ENCOUNTER — Telehealth (HOSPITAL_BASED_OUTPATIENT_CLINIC_OR_DEPARTMENT_OTHER): Payer: Self-pay

## 2022-10-22 ENCOUNTER — Ambulatory Visit (INDEPENDENT_AMBULATORY_CARE_PROVIDER_SITE_OTHER): Payer: BC Managed Care – PPO | Admitting: Family Medicine

## 2022-10-22 VITALS — BP 120/70 | HR 63 | Temp 98.3°F | Resp 18 | Ht 65.5 in | Wt 186.6 lb

## 2022-10-22 DIAGNOSIS — F418 Other specified anxiety disorders: Secondary | ICD-10-CM | POA: Diagnosis not present

## 2022-10-22 DIAGNOSIS — E785 Hyperlipidemia, unspecified: Secondary | ICD-10-CM | POA: Diagnosis not present

## 2022-10-22 DIAGNOSIS — Z1231 Encounter for screening mammogram for malignant neoplasm of breast: Secondary | ICD-10-CM | POA: Diagnosis not present

## 2022-10-22 DIAGNOSIS — J069 Acute upper respiratory infection, unspecified: Secondary | ICD-10-CM | POA: Diagnosis not present

## 2022-10-22 DIAGNOSIS — N390 Urinary tract infection, site not specified: Secondary | ICD-10-CM

## 2022-10-22 DIAGNOSIS — R600 Localized edema: Secondary | ICD-10-CM

## 2022-10-22 DIAGNOSIS — I10 Essential (primary) hypertension: Secondary | ICD-10-CM

## 2022-10-22 DIAGNOSIS — Z0000 Encounter for general adult medical examination without abnormal findings: Secondary | ICD-10-CM | POA: Diagnosis not present

## 2022-10-22 LAB — COMPREHENSIVE METABOLIC PANEL
ALT: 11 U/L (ref 0–35)
AST: 14 U/L (ref 0–37)
Albumin: 3.9 g/dL (ref 3.5–5.2)
Alkaline Phosphatase: 72 U/L (ref 39–117)
BUN: 15 mg/dL (ref 6–23)
CO2: 32 mEq/L (ref 19–32)
Calcium: 9.2 mg/dL (ref 8.4–10.5)
Chloride: 101 mEq/L (ref 96–112)
Creatinine, Ser: 0.95 mg/dL (ref 0.40–1.20)
GFR: 64.57 mL/min (ref >60.00)
Glucose, Bld: 98 mg/dL (ref 70–99)
Potassium: 4.2 mEq/L (ref 3.5–5.1)
Sodium: 140 mEq/L (ref 135–145)
Total Bilirubin: 0.5 mg/dL (ref 0.2–1.2)
Total Protein: 6.3 g/dL (ref 6.0–8.3)

## 2022-10-22 LAB — CBC WITH DIFFERENTIAL/PLATELET
Basophils Absolute: 0.1 10*3/uL (ref 0.0–0.1)
Basophils Relative: 1.4 % (ref 0.0–3.0)
Eosinophils Absolute: 0.2 10*3/uL (ref 0.0–0.7)
Eosinophils Relative: 5.7 % — ABNORMAL HIGH (ref 0.0–5.0)
HCT: 43.2 % (ref 36.0–46.0)
Hemoglobin: 14.2 g/dL (ref 12.0–15.0)
Lymphocytes Relative: 31.2 % (ref 12.0–46.0)
Lymphs Abs: 1.3 10*3/uL (ref 0.7–4.0)
MCHC: 32.7 g/dL (ref 30.0–36.0)
MCV: 92.6 fl (ref 78.0–100.0)
Monocytes Absolute: 0.3 10*3/uL (ref 0.1–1.0)
Monocytes Relative: 6.7 % (ref 3.0–12.0)
Neutro Abs: 2.3 10*3/uL (ref 1.4–7.7)
Neutrophils Relative %: 55 % (ref 43.0–77.0)
Platelets: 221 10*3/uL (ref 150.0–400.0)
RBC: 4.67 Mil/uL (ref 3.87–5.11)
RDW: 13.2 % (ref 11.5–15.5)
WBC: 4.2 10*3/uL (ref 4.0–10.5)

## 2022-10-22 LAB — LIPID PANEL
Cholesterol: 230 mg/dL — ABNORMAL HIGH (ref 0–200)
HDL: 50.3 mg/dL (ref >39.00)
LDL Cholesterol: 162 mg/dL — ABNORMAL HIGH (ref 0–99)
NonHDL: 179.29
Total CHOL/HDL Ratio: 5
Triglycerides: 87 mg/dL (ref 0.0–149.0)
VLDL: 17.4 mg/dL (ref 0.0–40.0)

## 2022-10-22 LAB — VITAMIN B12: Vitamin B-12: 143 pg/mL — ABNORMAL LOW (ref 211–911)

## 2022-10-22 LAB — VITAMIN D 25 HYDROXY (VIT D DEFICIENCY, FRACTURES): VITD: 47.77 ng/mL (ref 30.00–100.00)

## 2022-10-22 LAB — TSH: TSH: 3.74 u[IU]/mL (ref 0.35–5.50)

## 2022-10-22 MED ORDER — SIMVASTATIN 20 MG PO TABS
20.0000 mg | ORAL_TABLET | Freq: Every day | ORAL | 3 refills | Status: DC
Start: 2022-10-22 — End: 2023-11-14

## 2022-10-22 MED ORDER — TRIMETHOPRIM 100 MG PO TABS: 100.0000 mg | ORAL_TABLET | Freq: Every day | ORAL | 3 refills | Status: DC

## 2022-10-22 MED ORDER — BUPROPION HCL ER (XL) 150 MG PO TB24: 150.0000 mg | ORAL_TABLET | Freq: Every day | ORAL | 3 refills | Status: DC

## 2022-10-22 MED ORDER — CITALOPRAM HYDROBROMIDE 40 MG PO TABS
40.0000 mg | ORAL_TABLET | Freq: Every day | ORAL | 3 refills | Status: DC
Start: 2022-10-22 — End: 2023-06-14

## 2022-10-22 MED ORDER — TRIAMTERENE-HCTZ 37.5-25 MG PO TABS
1.0000 | ORAL_TABLET | Freq: Every day | ORAL | 3 refills | Status: DC
Start: 2022-10-22 — End: 2023-11-14

## 2022-10-22 MED ORDER — FLUTICASONE PROPIONATE 50 MCG/ACT NA SUSP
2.0000 | Freq: Every day | NASAL | 11 refills | Status: DC
Start: 2022-10-22 — End: 2023-11-14

## 2022-10-22 NOTE — Progress Notes (Signed)
Established Patient Office Visit  Subjective   Patient ID: [name], female    DOB: 19-Nov-1960  Age: 62 y.o. MRN: [id number]  Chief Complaint  Patient presents with   Annual Exam    Pt states fasting     HPI Discussed the use of AI scribe software for clinical note transcription with the patient, who gave verbal consent to proceed.  History of Present Illness   The patient, a Pensions consultant, presents for a physical examination. She reports experiencing dizzy spells over the past three months, occurring approximately four times. The spells are described as a sensation of imbalance, particularly noticeable when standing up or after exiting a vehicle. The patient also notes that her right ear feels blocked during these episodes.  The patient expresses concern about her weight, reporting persistent fatigue and joint pain in the feet, ankles, and knees. She describes the pain as intermittent, with a sensation of shooting pain in the feet. Despite efforts to maintain a healthy diet and portion control, the patient has not observed significant weight loss. She also mentions that her hair has been falling out, which she attributes to her diet and has been taking Nutrafol to address this issue.  The patient also mentions the need for a mammogram and a refill of several medications, including Flonase, simvastatin, Bactrim, Wellbutrin, Maxzide, and citalopram. She expresses a preference for getting her mammogram and medications from a location closer to her workplace in [place].      Patient Active Problem List   Diagnosis Date Noted   Fall 03/19/2021   Depression with anxiety 03/19/2021   Recurrent UTI 05/15/2020   Primary hypertension 05/15/2020   Morbid obesity (HCC) 05/15/2020   URI (upper respiratory infection) 02/01/2018   Hyperlipidemia LDL goal <100 05/27/2016   Preventative health care 05/27/2016   Hidradenitis axillaris 12/16/2014   Acute midline low back pain with  right-sided sciatica 08/19/2014   Anxiety 04/11/2014   Edema of extremities 04/11/2014   Past Medical History:  Diagnosis Date   Anxiety    Cervical cancer [facility])    Past Surgical History:  Procedure Laterality Date   ABDOMINAL HYSTERECTOMY  2008   COLONOSCOPY  2015   LAPAROSCOPIC RADICAL TOTAL HYSTERECTOMY W/ NODE BIOPSY     still has ovaries   RADIOACTIVE SEED GUIDED EXCISIONAL BREAST BIOPSY Right 04/01/2016   Procedure: RADIOACTIVE SEED GUIDED EXCISIONAL BREAST BIOPSY;  Surgeon: Emelia Loron, MD;  Location: [facility];  Service: General;  Laterality: Right;   Social History   Tobacco Use   Smoking status: Never   Smokeless tobacco: Never  Substance Use Topics   Alcohol use: Yes    Alcohol/week: 0.0 standard drinks of alcohol   Drug use: No   Social History   Socioeconomic History   Marital status: Divorced    Spouse name: Not on file   Number of children: Not on file   Years of education: Not on file   Highest education level: Not on file  Occupational History   Occupation: paralegal    Comment: paralegal --- foreclosure  Tobacco Use   Smoking status: Never   Smokeless tobacco: Never  Substance and Sexual Activity   Alcohol use: Yes    Alcohol/week: 0.0 standard drinks of alcohol   Drug use: No   Sexual activity: Yes    Partners: Male  Other Topics Concern   Not on file  Social History Narrative   Exercising-- walking    Social Determinants of Health  Financial Resource Strain: Not on file  Food Insecurity: Not on file  Transportation Needs: Not on file  Physical Activity: Not on file  Stress: Not on file  Social Connections: Not on file  Intimate Partner Violence: Not on file   Family Status  Relation Name Status   Mother  Deceased   Father  Alive  No partnership data on file   Family History  Problem Relation Age of Onset   Dementia Mother    Prostate cancer Father    Allergies  Allergen Reactions    Hydrocodone-Acetaminophen Hives and Rash    Experiences full body rash with Vicodin   Vicodin [Hydrocodone-Acetaminophen] Rash      Review of Systems  Constitutional:  Negative for chills, fever and malaise/fatigue.  HENT:  Negative for congestion and hearing loss.   Eyes:  Negative for blurred vision and discharge.  Respiratory:  Negative for cough, sputum production and shortness of breath.   Cardiovascular:  Negative for chest pain, palpitations and leg swelling.  Gastrointestinal:  Negative for abdominal pain, blood in stool, constipation, diarrhea, heartburn, nausea and vomiting.  Genitourinary:  Negative for dysuria, frequency, hematuria and urgency.  Musculoskeletal:  Negative for back pain, falls and myalgias.  Skin:  Negative for rash.  Neurological:  Negative for dizziness, sensory change, loss of consciousness, weakness and headaches.  Endo/Heme/Allergies:  Negative for environmental allergies. Does not bruise/bleed easily.  Psychiatric/Behavioral:  Negative for depression and suicidal ideas. The patient is not nervous/anxious and does not have insomnia.       Objective:     BP 120/70 (BP Location: Right Arm, Patient Position: Sitting, Cuff Size: Normal)   Pulse 63   Temp 98.3 F (36.8 C) (Oral)   Resp 18   Ht 5' 5.5" (1.664 m)   Wt 186 lb 9.6 oz (84.6 kg)   SpO2 99%   BMI 30.58 kg/m  BP Readings from Last 3 Encounters:  10/22/22 120/70  09/29/21 118/78  03/19/21 128/80   Wt Readings from Last 3 Encounters:  10/22/22 186 lb 9.6 oz (84.6 kg)  09/29/21 184 lb 3.2 oz (83.6 kg)  03/19/21 188 lb 9.6 oz (85.5 kg)   SpO2 Readings from Last 3 Encounters:  10/22/22 99%  09/29/21 98%  03/19/21 98%      Physical Exam Vitals and nursing note reviewed.  Constitutional:      General: She is not in acute distress.    Appearance: Normal appearance. She is well-developed.  HENT:     Head: Normocephalic and atraumatic.     Right Ear: Tympanic membrane, ear canal  and external ear normal. There is no impacted cerumen.     Left Ear: Tympanic membrane, ear canal and external ear normal. There is no impacted cerumen.     Nose: Nose normal.     Mouth/Throat:     Mouth: Mucous membranes are moist.     Pharynx: Oropharynx is clear. No oropharyngeal exudate or posterior oropharyngeal erythema.  Eyes:     General: No scleral icterus.       Right eye: No discharge.        Left eye: No discharge.     Conjunctiva/sclera: Conjunctivae normal.     Pupils: Pupils are equal, round, and reactive to light.  Neck:     Thyroid: No thyromegaly or thyroid tenderness.     Vascular: No JVD.  Cardiovascular:     Rate and Rhythm: Normal rate and regular rhythm.     Heart sounds: Normal  heart sounds. No murmur heard. Pulmonary:     Effort: Pulmonary effort is normal. No respiratory distress.     Breath sounds: Normal breath sounds.  Abdominal:     General: Bowel sounds are normal. There is no distension.     Palpations: Abdomen is soft. There is no mass.     Tenderness: There is no abdominal tenderness. There is no guarding or rebound.  Genitourinary:    Vagina: Normal.  Musculoskeletal:        General: Normal range of motion.     Cervical back: Normal range of motion and neck supple.     Right lower leg: No edema.     Left lower leg: No edema.  Lymphadenopathy:     Cervical: No cervical adenopathy.  Skin:    General: Skin is warm and dry.     Findings: No erythema or rash.  Neurological:     General: No focal deficit present.     Mental Status: She is alert and oriented to person, place, and time.     Cranial Nerves: No cranial nerve deficit.     Deep Tendon Reflexes: Reflexes are normal and symmetric.  Psychiatric:        Mood and Affect: Mood normal.        Behavior: Behavior normal.        Thought Content: Thought content normal.        Judgment: Judgment normal.      No results found for any visits on 10/22/22.  Last CBC Lab Results   Component Value Date   WBC 5.5 03/19/2021   HGB 13.3 03/19/2021   HCT 39.8 03/19/2021   MCV 92.2 03/19/2021   MCH 30.9 12/29/2018   RDW 13.2 03/19/2021   PLT 215.0 03/19/2021   Last metabolic panel Lab Results  Component Value Date   GLUCOSE 78 03/19/2021   NA 139 03/19/2021   K 4.0 03/19/2021   CL 100 03/19/2021   CO2 33 (H) 03/19/2021   BUN 18 03/19/2021   CREATININE 0.95 03/19/2021   GFR 65.30 03/19/2021   CALCIUM 9.2 03/19/2021   PROT 6.5 03/19/2021   ALBUMIN 4.3 03/19/2021   BILITOT 0.4 03/19/2021   ALKPHOS 77 03/19/2021   AST 19 03/19/2021   ALT 16 03/19/2021   ANIONGAP 8 03/25/2016   Last lipids Lab Results  Component Value Date   CHOL 175 03/19/2021   HDL 52.80 03/19/2021   LDLCALC 102 (H) 03/19/2021   TRIG 99.0 03/19/2021   CHOLHDL 3 03/19/2021   Last hemoglobin A1c No results found for: "HGBA1C" Last thyroid functions Lab Results  Component Value Date   TSH 4.75 03/19/2021   Last vitamin D Lab Results  Component Value Date   VD25OH 38.48 05/31/2017   Last vitamin B12 and Folate No results found for: "VITAMINB12", "FOLATE"    The 10-year ASCVD risk score (Arnett DK, et al., 2019) is: 4%    Assessment & Plan:   Problem List Items Addressed This Visit       Unprioritized   Edema of extremities   Relevant Medications   triamterene-hydrochlorothiazide (MAXZIDE-25) 37.5-25 MG tablet   Recurrent UTI   Relevant Medications   trimethoprim (TRIMPEX) 100 MG tablet   Hyperlipidemia LDL goal <100   Relevant Medications   simvastatin (ZOCOR) 20 MG tablet   triamterene-hydrochlorothiazide (MAXZIDE-25) 37.5-25 MG tablet   Other Relevant Orders   CBC with Differential/Platelet   Comprehensive metabolic panel   Lipid panel   TSH  TSH   Vitamin B12   VITAMIN D 25 Hydroxy (Vit-D Deficiency, Fractures)   Depression with anxiety   Relevant Medications   buPROPion (WELLBUTRIN XL) 150 MG 24 hr tablet   citalopram (CELEXA) 40 MG tablet    Primary hypertension    Well controlled, no changes to meds. Encouraged heart healthy diet such as the DASH diet and exercise as tolerated.        Relevant Medications   simvastatin (ZOCOR) 20 MG tablet   triamterene-hydrochlorothiazide (MAXZIDE-25) 37.5-25 MG tablet   Preventative health care - Primary    Ghm utd Check labs  See AVS  Health Maintenance  Topic Date Due   MAMMOGRAM  02/17/2017   COVID-19 Vaccine (3 - 2023-24 season) 10/30/2021   INFLUENZA VACCINE  09/30/2022   Colonoscopy  07/14/2023   DTaP/Tdap/Td (3 - Td or Tdap) 11/23/2024   Hepatitis C Screening  Completed   HIV Screening  Completed   Zoster Vaccines- Shingrix  Completed   HPV VACCINES  Aged Out         Relevant Orders   CBC with Differential/Platelet   Comprehensive metabolic panel   Lipid panel   TSH   TSH   Vitamin B12   VITAMIN D 25 Hydroxy (Vit-D Deficiency, Fractures)   Other Visit Diagnoses     Screening mammogram for breast cancer       Relevant Orders   MM DIGITAL SCREENING BILATERAL   Viral URI with cough       Relevant Medications   fluticasone (FLONASE) 50 MCG/ACT nasal spray   trimethoprim (TRIMPEX) 100 MG tablet     Assessment and Plan    Dizziness Occurring intermittently over the past three months, possibly related to fluid in the right ear. No associated chest pain, shortness of breath, or palpitations. -Continue Flonase and antihistamine as needed. -Consider ENT referral if symptoms persist.  Weight Gain Patient reports difficulty losing weight despite dietary changes and increased physical activity. Experiencing associated joint pain. Discussed potential weight loss medications and their cost. -Check vitamin levels to rule out deficiencies. -Consider Tylenol Arthritis for joint pain. -Recommended patient to inquire about weight loss medication coverage with HR and consider consultation at the [facility] in [place].  General Health Maintenance -Refill Flonase,  Simvastatin, Bactrim, Wellbutrin, Maxzide, Citalopram. -Check mammogram availability at local facilities. -Colonoscopy due May 2025. -Tetanus due 2026.        No follow-ups on file.    Donato Schultz, DO

## 2022-10-22 NOTE — Assessment & Plan Note (Signed)
Ghm utd Check labs  See AVS  Health Maintenance  Topic Date Due   MAMMOGRAM  02/17/2017   COVID-19 Vaccine (3 - 2023-24 season) 10/30/2021   INFLUENZA VACCINE  09/30/2022   Colonoscopy  07/14/2023   DTaP/Tdap/Td (3 - Td or Tdap) 11/23/2024   Hepatitis C Screening  Completed   HIV Screening  Completed   Zoster Vaccines- Shingrix  Completed   HPV VACCINES  Aged Out

## 2022-10-22 NOTE — Assessment & Plan Note (Signed)
Encourage heart healthy diet such as MIND or DASH diet, increase exercise, avoid trans fats, simple carbohydrates and processed foods, consider a krill or fish or flaxseed oil cap daily.  °

## 2022-10-22 NOTE — Assessment & Plan Note (Signed)
Well controlled, no changes to meds. Encouraged heart healthy diet such as the DASH diet and exercise as tolerated.  °

## 2023-04-12 DIAGNOSIS — L720 Epidermal cyst: Secondary | ICD-10-CM | POA: Diagnosis not present

## 2023-04-12 DIAGNOSIS — L0109 Other impetigo: Secondary | ICD-10-CM | POA: Diagnosis not present

## 2023-04-28 DIAGNOSIS — L02212 Cutaneous abscess of back [any part, except buttock]: Secondary | ICD-10-CM | POA: Diagnosis not present

## 2023-04-28 DIAGNOSIS — L723 Sebaceous cyst: Secondary | ICD-10-CM | POA: Diagnosis not present

## 2023-04-28 DIAGNOSIS — L0889 Other specified local infections of the skin and subcutaneous tissue: Secondary | ICD-10-CM | POA: Diagnosis not present

## 2023-04-28 DIAGNOSIS — L720 Epidermal cyst: Secondary | ICD-10-CM | POA: Diagnosis not present

## 2023-06-14 ENCOUNTER — Encounter: Payer: Self-pay | Admitting: Surgery

## 2023-06-14 ENCOUNTER — Ambulatory Visit
Admission: RE | Admit: 2023-06-14 | Discharge: 2023-06-14 | Disposition: A | Discharge status: Home / Self Care | Source: Ambulatory Visit | Attending: Family Medicine | Admitting: Family Medicine

## 2023-06-14 ENCOUNTER — Ambulatory Visit: Admitting: Surgery

## 2023-06-14 VITALS — BP 129/74 | HR 72 | Temp 97.7°F | Ht 66.0 in | Wt 165.6 lb

## 2023-06-14 DIAGNOSIS — K645 Perianal venous thrombosis: Secondary | ICD-10-CM | POA: Diagnosis not present

## 2023-06-14 DIAGNOSIS — Z1231 Encounter for screening mammogram for malignant neoplasm of breast: Secondary | ICD-10-CM

## 2023-06-14 NOTE — Progress Notes (Signed)
 Patient ID: [name], female   DOB: [birth year], 63 y.o.   MRN: [id number]  Chief Complaint: Thrombosed hemorrhoid  History of Present Illness [name] is a 63 y.o. female with essentially her third episode of thrombosed hemorrhoid.  Last episode was 7 years ago, first episode was after the birth of her only child which was 3 decades ago.  She had an upset stomach which to her mean she had 3 bowel movements early in the weekend.  She had lifted some boxes on Friday where she believes she held her breath and strained a bit on Friday.  She reports she usually has a daily bowel movement which is uneventful and comfortable and never painful.  She reports she was on her feet all day Saturday Sunday preparing for shower.  She began having pain on Sunday.  She denies seeing any blood.  She has also had associated burning and itching.  She reports in the same area where she had a thrombosis 7 years ago.  She did have a procedure 30 years ago in the office, but the last one was allowed to regress as she had missed the window for enucleation intervention.  It got better with time.  She has utilized Preparation H cooling gel, and wipes over the last 2 days.  Her last colonoscopy was 10 years ago.  Past Medical History Past Medical History:  Diagnosis Date   Anxiety    Cervical cancer [facility])       Past Surgical History:  Procedure Laterality Date   ABDOMINAL HYSTERECTOMY  2008   BREAST BIOPSY Right 2018   BREAST EXCISIONAL BIOPSY Right 2018   COLONOSCOPY  2015   LAPAROSCOPIC RADICAL TOTAL HYSTERECTOMY W/ NODE BIOPSY     still has ovaries   RADIOACTIVE SEED GUIDED EXCISIONAL BREAST BIOPSY Right 04/01/2016   Procedure: RADIOACTIVE SEED GUIDED EXCISIONAL BREAST BIOPSY;  Surgeon: Emelia Loron, MD;  Location: [facility];  Service: General;  Laterality: Right;    Allergies  Allergen Reactions   Hydrocodone-Acetaminophen Hives and Rash    Experiences full body rash with Vicodin    Vicodin [Hydrocodone-Acetaminophen] Rash    Current Outpatient Medications  Medication Sig Dispense Refill   BIOTIN PO Take by mouth.     buPROPion (WELLBUTRIN XL) 150 MG 24 hr tablet Take 1 tablet (150 mg total) by mouth daily. 90 tablet 3   cholecalciferol (VITAMIN D) 1000 units tablet Take 2 tablets (2,000 Units total) by mouth daily. 90 tablet 3   fluticasone (FLONASE) 50 MCG/ACT nasal spray Place 2 sprays into both nostrils daily. 16 g 11   simvastatin (ZOCOR) 20 MG tablet Take 1 tablet (20 mg total) by mouth at bedtime. 90 tablet 3   triamterene-hydrochlorothiazide (MAXZIDE-25) 37.5-25 MG tablet Take 1 tablet by mouth daily. 90 tablet 3   trimethoprim (TRIMPEX) 100 MG tablet Take 1 tablet (100 mg total) by mouth daily. 90 tablet 3   No current facility-administered medications for this visit.    Family History Family History  Problem Relation Age of Onset   Dementia Mother    Prostate cancer Father       Social History Social History   Tobacco Use   Smoking status: Never   Smokeless tobacco: Never  Substance Use Topics   Alcohol use: Yes    Alcohol/week: 0.0 standard drinks of alcohol   Drug use: No        Review of Systems  Constitutional: Negative.   HENT: Negative.  Eyes: Negative.   Respiratory: Negative.    Cardiovascular: Negative.   Gastrointestinal:  Negative for abdominal pain, blood in stool, constipation, diarrhea, melena, nausea and vomiting.  Genitourinary: Negative.  Negative for dysuria (Reports having frequent UTIs, so therefore avoids sitz bath's and tub bathing.).  Skin: Negative.      Physical Exam Blood pressure 129/74, pulse 72, temperature 97.7 F (36.5 C), temperature source Oral, height 5\' 6"  (1.676 m), weight 165 lb 9.6 oz (75.1 kg), SpO2 98%. Last Weight  Most recent update: 06/14/2023 10:31 AM    Weight  75.1 kg (165 lb 9.6 oz)             CONSTITUTIONAL: Well developed, and nourished, appropriately responsive and aware  without distress.   EYES: Sclera non-icteric.   EARS, NOSE, MOUTH AND THROAT:  The oropharynx is clear. Oral mucosa is pink and moist.    Hearing is intact to voice.  NECK: Trachea is midline, and there is no jugular venous distension.  LYMPH NODES:  Lymph nodes in the neck are not appreciated. RESPIRATORY:   Normal respiratory effort without pathologic use of accessory muscles. CARDIOVASCULAR: Well perfused.  GI: The abdomen is  soft, nontender, and nondistended. GU: Marylene Land is present as chaperone.  We had the patient in the kneeling position.  She does have 1 prominent external hemorrhoidal tag at 530.  It is pink and soft, supple without evidence of bluish discoloration.  She reports this is changed significantly since she looked at it yesterday.  It did not appear to be significantly tender on palpation.  She has minimal other hemorrhoidal tissue present.  Good anal tone, without any palpable distal rectal abnormality. MUSCULOSKELETAL:  Symmetrical muscle tone appreciated in all four extremities.    SKIN: Skin turgor is normal. No pathologic skin lesions appreciated.  NEUROLOGIC:  Motor and sensation appear grossly normal.  Cranial nerves are grossly without defect. PSYCH:  Alert and oriented to person, place and time. Affect is appropriate for situation.  Data Reviewed I have personally reviewed what is currently available of the patient's imaging, recent labs and medical records.   Labs:     Latest Ref Rng & Units 10/22/2022    9:17 AM 03/19/2021    3:32 PM 12/29/2018    3:20 PM  CBC  WBC 4.0 - 10.5 K/uL 4.2  5.5  5.1   Hemoglobin 12.0 - 15.0 g/dL 40.9  81.1  91.4   Hematocrit 36.0 - 46.0 % 43.2  39.8  41.3   Platelets 150.0 - 400.0 K/uL 221.0  215.0  247       Latest Ref Rng & Units 10/22/2022    9:17 AM 03/19/2021    3:32 PM 05/15/2020    4:08 PM  CMP  Glucose 70 - 99 mg/dL 98  78  782   BUN 6 - 23 mg/dL 15  18  17    Creatinine 0.40 - 1.20 mg/dL 9.56  2.13  0.86   Sodium 135 -  145 mEq/L 140  139  141   Potassium 3.5 - 5.1 mEq/L 4.2  4.0  3.7   Chloride 96 - 112 mEq/L 101  100  102   CO2 19 - 32 mEq/L 32  33  32   Calcium 8.4 - 10.5 mg/dL 9.2  9.2  9.1   Total Protein 6.0 - 8.3 g/dL 6.3  6.5  5.9   Total Bilirubin 0.2 - 1.2 mg/dL 0.5  0.4  0.3   Alkaline Phos 39 -  117 U/L 72  77  74   AST 0 - 37 U/L 14  19  16    ALT 0 - 35 U/L 11  16  12       Imaging: Radiological images reviewed:  Within last 24 hrs: No results found.  Assessment    Thrombosed hemorrhoid, spontaneously resolving from initial presentation. Patient Active Problem List   Diagnosis Date Noted   Fall 03/19/2021   Depression with anxiety 03/19/2021   Recurrent UTI 05/15/2020   Primary hypertension 05/15/2020   Morbid obesity (HCC) 05/15/2020   URI (upper respiratory infection) 02/01/2018   Hyperlipidemia LDL goal <100 05/27/2016   Preventative health care 05/27/2016   Hidradenitis axillaris 12/16/2014   Acute midline low back pain with right-sided sciatica 08/19/2014   Anxiety 04/11/2014   Edema of extremities 04/11/2014    Plan    I offered to proceed with limited external hemorrhoidectomy here in the office under local anesthesia.  She felt that it has improved since its onset, and would like to continue to allow it to regress as had happened previously.  Be glad to have her follow-up as needed.  Or if she would like to entertain an elective removal of this area in the future. Gave recommendations to be more proactive regarding her colon and rectal health.  And advised consideration of supplementation with fiber supplements and improving/increasing frequency of her bowel activity, to smaller movements more frequently.  Advised to pursue a goal of 25 to 30 g of fiber daily.  The majority of this may be through natural sources, advised to ensure a minimal daily fiber supplementation.  Various forms of supplements discussed.  Recommend Psyllium husk, with options of mixing with beverage  or applesauce to make more tolerable. Strongly advised to consume more fluids(especially in proximity to fiber intake) and to ensure adequate hydration.   Watch color of urine to determine adequacy of hydration.  Clarity is pursued in urine output, and bowel activity that responds and corresponds to significant meal intake.   We need to avoid deferring having bowel movements, advised to take the time at the first sign of sensation, typically following meals, and in the morning.  The need to avoid more frequently, and the presence of flatus may indicate the need for bowel movement.  Do not defer for later.  Addition of MiraLAX (or its generic equivalent) may be needed ensure at least twice daily bowel movements.  If multiple doses of MiraLAX are necessary utilize them. Never skip a day... Do not tolerate a day without a bowel movement unless you are fasting.  To be regular, we must do the above EVERY day.   Soluble Fiber Dissolves in Water: Soluble fiber dissolves in water to form a gel-like substance. Slows Digestion: This type of fiber slows down digestion, which can help control blood sugar levels and lower cholesterol. Sources: Common sources include oats, beans, apples, citrus fruits, and psyllium. Benefits: Helps manage cholesterol levels. Aids in blood sugar control. Increases healthy gut bacteria, which can lower inflammation and improve digestion.  Insoluble Fiber Does Not Dissolve in Water: Insoluble fiber does not dissolve in water and remains mostly intact as it passes through the digestive system. Adds Bulk to Stool: It adds bulk to stool, which helps promote regular bowel movements and prevent constipation. Sources: Common sources include whole grains, nuts, beans, and vegetables like cauliflower and potatoes. Benefits: Improves bowel health and regularity. Reduces the risk of colorectal conditions like hemorrhoids and diverticulitis. Supports insulin sensitivity  in people with  diabetes.  Both types of fiber are essential for overall health, and it's beneficial to include a 50/50 mix of both in your diet.     Face-to-face time spent with the patient and accompanying care providers(if present) was 30 minutes, spent counseling, educating, and coordinating care of the patient.    These notes generated with voice recognition software. I apologize for typographical errors.  Flynn Hylan M.D., FACS 06/14/2023, 11:05 AM

## 2023-06-14 NOTE — Patient Instructions (Signed)
 Advised to pursue a goal of 25 to 30 g of fiber daily.  The majority of this may be through natural sources, advised to ensure a minimal daily fiber supplementation.  Various forms of supplements discussed.  Recommend Psyllium husk, with options of mixing with beverage or applesauce to make more tolerable. Strongly advised to consume more fluids(especially in proximity to fiber intake) and to ensure adequate hydration.   Watch color of urine to determine adequacy of hydration.  Clarity is pursued in urine output, and bowel activity that responds and corresponds to significant meal intake.   We need to avoid deferring having bowel movements, advised to take the time at the first sign of sensation, typically following meals, and in the morning.  The need to avoid more frequently, and the presence of flatus may indicate the need for bowel movement.  Do not defer for later.  Addition of MiraLAX (or its generic equivalent) may be needed ensure at least twice daily bowel movements.  If multiple doses of MiraLAX are necessary utilize them. Never skip a day... Do not tolerate a day without a bowel movement unless you are fasting.  To be regular, we must do the above EVERY day.   Soluble Fiber Dissolves in Water: Soluble fiber dissolves in water to form a gel-like substance. Slows Digestion: This type of fiber slows down digestion, which can help control blood sugar levels and lower cholesterol. Sources: Common sources include oats, beans, apples, citrus fruits, and psyllium. Benefits: Helps manage cholesterol levels. Aids in blood sugar control. Increases healthy gut bacteria, which can lower inflammation and improve digestion.  Insoluble Fiber Does Not Dissolve in Water: Insoluble fiber does not dissolve in water and remains mostly intact as it passes through the digestive system. Adds Bulk to Stool: It adds bulk to stool, which helps promote regular bowel movements and prevent constipation. Sources:  Common sources include whole grains, nuts, beans, and vegetables like cauliflower and potatoes. Benefits: Improves bowel health and regularity. Reduces the risk of colorectal conditions like hemorrhoids and diverticulitis. Supports insulin sensitivity in people with diabetes.  Both types of fiber are essential for overall health, and it's beneficial to include a 50/50 mix of both in your diet.    Hemorrhoids  Hemorrhoids are swollen veins in and around the rectum or the opening of the butt (anus). There are two types of hemorrhoids: Internal. These occur in the veins just inside the rectum. They may poke through to the outside and become irritated and painful. External. These occur in the veins outside the anus. They can be felt as a painful swelling or hard lump near the anus. Most hemorrhoids do not cause severe problems. Often, they can be treated at home with diet and lifestyle changes. If home treatments do not help, you may need a procedure to shrink or remove the hemorrhoids. What are the causes? Hemorrhoids are caused by pressure near the anus. This pressure may be caused by: Constipation or diarrhea. Straining to poop. Pregnancy. Obesity. Sitting or riding a bike for a long time. Heavy lifting or other things that cause you to strain. Anal sex. What are the signs or symptoms? Symptoms of this condition include: Pain. Anal itching or irritation. Bleeding from the rectum. Leakage of poop (stool). Swelling of the anus. One or more lumps around the anus. How is this diagnosed? Hemorrhoids can often be diagnosed through a visual exam. Other exams or tests may also be done, such as: A digital rectal exam. This is when  your health care provider feels inside your rectum with a gloved finger. Anoscope. This is an exam of the anus using a small tube. A blood test, if you have lost a lot of blood. A sigmoidoscopy or colonoscopy. These are tests to look inside the colon using a tube  with a camera on the end. How is this treated? In most cases, hemorrhoids can be treated at home with diet and lifestyle changes. If these changes do not help, you may need to have a procedure done. These procedures can make the hemorrhoids smaller or fully remove them. Common procedures include: Rubber band ligation. Rubber bands are placed at the base of the hemorrhoids to cut off their blood supply. Sclerotherapy. Medicine is put into the hemorrhoids to shrink them. Infrared coagulation. A type of light energy is used to get rid of the hemorrhoids. Hemorrhoidectomy surgery. The hemorrhoids are removed during surgery. Then, the veins that supply them are tied off. Stapled hemorrhoidopexy surgery. The base of the hemorrhoid is stapled to the wall of the rectum. Follow these instructions at home: Medicines Take over-the-counter and prescription medicines only as told by your provider. Use medicated creams or medicines that are put in the rectum (suppositories) as told by your provider. Eating and drinking  Eat foods that are high in fiber, such as beans, whole grains, and fresh fruits and vegetables. Ask your provider about taking products that have fiber added to them (fiber supplements). Reduce the amount of fat in your diet. You can do this by eating low-fat dairy products, eating less red meat, and avoiding processed foods. Drink enough fluid to keep your pee (urine) pale yellow. Managing pain and swelling  Take warm sitz baths for 20 minutes, 3-4 times a day. This can help ease pain and discomfort. You may do this in a bathtub or you can use a portable sitz bath that fits over the toilet. If told, put ice on the affected area. It may help to use ice packs between sitz baths. Put ice in a plastic bag. Place a towel between your skin and the bag. Leave the ice on for 20 minutes, 2-3 times a day. If your skin turns bright red, remove the ice right away to prevent skin damage. The risk of  damage is higher if you cannot feel pain, heat, or cold. General instructions Exercise. Ask your provider how much and what kind of exercise is best for you. In general, you should do moderate exercise for at least 30 minutes on most days of the week (150 minutes each week). You may want to try walking, biking, or yoga. Go to the bathroom when you have the urge to poop. Do not wait. Avoid straining to poop. Keep the anus dry and clean. Use wet toilet paper or moist towelettes after you poop. Do not sit on the toilet for a long time. This can increase blood pooling and pain. Where to find more information [employer] of Diabetes and Digestive and Kidney Diseases: [email or web address] Contact a health care provider if: You have more pain and swelling that do not get better with treatment. You have trouble pooping or you are not able to poop. You have pain or inflammation outside the area of the hemorrhoids. Get help right away if: You are bleeding from your rectum and you cannot get it to stop. This information is not intended to replace advice given to you by your health care provider. Make sure you discuss any questions you have with your  health care provider. Document Revised: 10/28/2021 Document Reviewed: 10/28/2021 Elsevier Patient Education  2024 [employer].

## 2023-08-16 DIAGNOSIS — K449 Diaphragmatic hernia without obstruction or gangrene: Secondary | ICD-10-CM | POA: Diagnosis not present

## 2023-08-16 DIAGNOSIS — K625 Hemorrhage of anus and rectum: Secondary | ICD-10-CM | POA: Diagnosis not present

## 2023-08-16 DIAGNOSIS — Z1211 Encounter for screening for malignant neoplasm of colon: Secondary | ICD-10-CM | POA: Diagnosis not present

## 2023-08-16 DIAGNOSIS — K5730 Diverticulosis of large intestine without perforation or abscess without bleeding: Secondary | ICD-10-CM | POA: Diagnosis not present

## 2023-10-03 DIAGNOSIS — K648 Other hemorrhoids: Secondary | ICD-10-CM | POA: Diagnosis not present

## 2023-10-03 DIAGNOSIS — K5730 Diverticulosis of large intestine without perforation or abscess without bleeding: Secondary | ICD-10-CM | POA: Diagnosis not present

## 2023-10-03 DIAGNOSIS — Z1211 Encounter for screening for malignant neoplasm of colon: Secondary | ICD-10-CM | POA: Diagnosis not present

## 2023-11-10 ENCOUNTER — Other Ambulatory Visit: Payer: Self-pay | Admitting: Family Medicine

## 2023-11-10 DIAGNOSIS — R600 Localized edema: Secondary | ICD-10-CM

## 2023-11-10 DIAGNOSIS — F418 Other specified anxiety disorders: Secondary | ICD-10-CM

## 2023-11-14 ENCOUNTER — Encounter: Payer: Self-pay | Admitting: Family Medicine

## 2023-11-14 ENCOUNTER — Ambulatory Visit: Admitting: Family Medicine

## 2023-11-14 VITALS — BP 130/80 | HR 66 | Temp 98.2°F | Resp 16 | Ht 66.0 in | Wt 165.4 lb

## 2023-11-14 DIAGNOSIS — E538 Deficiency of other specified B group vitamins: Secondary | ICD-10-CM

## 2023-11-14 DIAGNOSIS — E785 Hyperlipidemia, unspecified: Secondary | ICD-10-CM

## 2023-11-14 DIAGNOSIS — Z0000 Encounter for general adult medical examination without abnormal findings: Secondary | ICD-10-CM | POA: Diagnosis not present

## 2023-11-14 DIAGNOSIS — F418 Other specified anxiety disorders: Secondary | ICD-10-CM

## 2023-11-14 DIAGNOSIS — J069 Acute upper respiratory infection, unspecified: Secondary | ICD-10-CM | POA: Diagnosis not present

## 2023-11-14 DIAGNOSIS — Z23 Encounter for immunization: Secondary | ICD-10-CM | POA: Diagnosis not present

## 2023-11-14 DIAGNOSIS — R600 Localized edema: Secondary | ICD-10-CM

## 2023-11-14 MED ORDER — SIMVASTATIN 20 MG PO TABS: 20.0000 mg | ORAL_TABLET | Freq: Every day | ORAL | 3 refills | Status: DC

## 2023-11-14 MED ORDER — TRIAMTERENE-HCTZ 37.5-25 MG PO TABS: 1.0000 | ORAL_TABLET | Freq: Every day | ORAL | 3 refills | Status: AC

## 2023-11-14 MED ORDER — FLUTICASONE PROPIONATE 50 MCG/ACT NA SUSP: 2.0000 | Freq: Every day | NASAL | 11 refills | Status: AC

## 2023-11-14 MED ORDER — BUPROPION HCL ER (XL) 150 MG PO TB24
150.0000 mg | ORAL_TABLET | Freq: Every day | ORAL | 3 refills | Status: AC
Start: 2023-11-14 — End: ?

## 2023-11-14 NOTE — Progress Notes (Signed)
 Subjective:    Patient ID: [name], female    DOB: 05-16-1960, 63 y.o.   MRN: [id number]  Chief Complaint  Patient presents with   Annual Exam    Pt states not fasting     HPI Patient is in today for cpe. Discussed the use of AI scribe software for clinical note transcription with the patient, who gave verbal consent to proceed.  History of Present Illness A 63 year old female who presents for an annual physical exam and medication refills.  She requests refills for her medications, which include Maxzide, simvastatin , Flonase , bupropion  (Wellbutrin ), and citalopram  (Celexa ).  She has a history of low B12 levels and is currently taking 2500 mcg of B12 pills daily, which has improved her symptoms. She previously received B12 injections.  She experiences urinary frequency, getting up two to three times at night to urinate. Limiting fluid intake before bedtime is challenging for her.  She had a cold three weeks ago and reports persistent ear symptoms, including hearing her own voice in one ear. She uses Flonase  and an antihistamine, which have provided some relief.  She reports knee pain, particularly when keeping it straight, with pain in the front and back of the knee. The pain is exacerbated by going up and down stairs, and she notes occasional popping but no feeling of instability. No recent falls or injuries.    Past Medical History:  Diagnosis Date   Anxiety    Cervical cancer (HCC)    Morbid obesity (HCC) 05/15/2020    Past Surgical History:  Procedure Laterality Date   ABDOMINAL HYSTERECTOMY  2008   BREAST BIOPSY Right 2018   BREAST EXCISIONAL BIOPSY Right 2018   COLONOSCOPY  2015   LAPAROSCOPIC RADICAL TOTAL HYSTERECTOMY W/ NODE BIOPSY     still has ovaries   RADIOACTIVE SEED GUIDED EXCISIONAL BREAST BIOPSY Right 04/01/2016   Procedure: RADIOACTIVE SEED GUIDED EXCISIONAL BREAST BIOPSY;  Surgeon: Donnice Bury, MD;  Location: [facility];   Service: General;  Laterality: Right;    Family History  Problem Relation Age of Onset   Dementia Mother    Prostate cancer Father     Social History   Socioeconomic History   Marital status: Divorced    Spouse name: Not on file   Number of children: Not on file   Years of education: Not on file   Highest education level: Some college, no degree  Occupational History   Occupation: paralegal    Comment: paralegal --- foreclosure  Tobacco Use   Smoking status: Never   Smokeless tobacco: Never  Substance and Sexual Activity   Alcohol use: Yes    Alcohol/week: 0.0 standard drinks of alcohol   Drug use: No   Sexual activity: Yes    Partners: Male  Other Topics Concern   Not on file  Social History Narrative   Exercising-- walking    Social Drivers of Health   Financial Resource Strain: Low Risk  (11/11/2023)   Overall Financial Resource Strain (CARDIA)    Difficulty of Paying Living Expenses: Not hard at all  Food Insecurity: No Food Insecurity (11/11/2023)   Hunger Vital Sign    Worried About Running Out of Food in the Last Year: Never true    Ran Out of Food in the Last Year: Never true  Transportation Needs: No Transportation Needs (11/11/2023)   PRAPARE - Administrator, Civil Service (Medical): No    Lack of Transportation (Non-Medical): No  Physical Activity: Insufficiently Active (11/11/2023)   Exercise Vital Sign    Days of Exercise per Week: 3 days    Minutes of Exercise per Session: 30 min  Stress: Stress Concern Present (11/11/2023)   [employer] of Occupational Health - Occupational Stress Questionnaire    Feeling of Stress: To some extent  Social Connections: Unknown (11/11/2023)   Social Connection and Isolation Panel    Frequency of Communication with Friends and Family: More than three times a week    Frequency of Social Gatherings with Friends and Family: More than three times a week    Attends Religious Services: More than 4 times per  year    Active Member of [employer] or Organizations: Not on file    Attends Banker Meetings: Not on file    Marital Status: Living with partner  Intimate Partner Violence: Not on file    Outpatient Medications Prior to Visit  Medication Sig Dispense Refill   BIOTIN PO Take by mouth.     cholecalciferol (VITAMIN D ) 1000 units tablet Take 2 tablets (2,000 Units total) by mouth daily. 90 tablet 3   trimethoprim  (TRIMPEX ) 100 MG tablet Take 1 tablet (100 mg total) by mouth daily. 90 tablet 3   buPROPion  (WELLBUTRIN  XL) 150 MG 24 hr tablet Take 1 tablet (150 mg total) by mouth daily. 90 tablet 3   fluticasone  (FLONASE ) 50 MCG/ACT nasal spray Place 2 sprays into both nostrils daily. 16 g 11   simvastatin  (ZOCOR ) 20 MG tablet Take 1 tablet (20 mg total) by mouth at bedtime. 90 tablet 3   triamterene -hydrochlorothiazide (MAXZIDE-25) 37.5-25 MG tablet Take 1 tablet by mouth daily. 90 tablet 3   No facility-administered medications prior to visit.    Allergies  Allergen Reactions   Hydrocodone-Acetaminophen  Hives and Rash    Experiences full body rash with Vicodin   Vicodin [Hydrocodone-Acetaminophen ] Rash    Review of Systems  Constitutional:  Negative for fever and malaise/fatigue.  HENT:  Negative for congestion.   Eyes:  Negative for blurred vision.  Respiratory:  Negative for cough and shortness of breath.   Cardiovascular:  Negative for chest pain, palpitations and leg swelling.  Gastrointestinal:  Negative for abdominal pain, blood in stool, nausea and vomiting.  Genitourinary:  Negative for dysuria and frequency.  Musculoskeletal:  Negative for back pain and falls.  Skin:  Negative for rash.  Neurological:  Negative for dizziness, loss of consciousness and headaches.  Endo/Heme/Allergies:  Negative for environmental allergies.  Psychiatric/Behavioral:  Negative for depression. The patient is not nervous/anxious.        Objective:    Physical Exam Vitals and  nursing note reviewed.  Constitutional:      General: She is not in acute distress.    Appearance: Normal appearance. She is well-developed.  HENT:     Head: Normocephalic and atraumatic.     Right Ear: Tympanic membrane, ear canal and external ear normal. There is no impacted cerumen.     Left Ear: Tympanic membrane, ear canal and external ear normal. There is no impacted cerumen.     Nose: Nose normal.     Mouth/Throat:     Mouth: Mucous membranes are moist.     Pharynx: Oropharynx is clear. No oropharyngeal exudate or posterior oropharyngeal erythema.  Eyes:     General: No scleral icterus.       Right eye: No discharge.        Left eye: No discharge.  Conjunctiva/sclera: Conjunctivae normal.     Pupils: Pupils are equal, round, and reactive to light.  Neck:     Thyroid : No thyromegaly or thyroid  tenderness.     Vascular: No JVD.  Cardiovascular:     Rate and Rhythm: Normal rate and regular rhythm.     Heart sounds: Normal heart sounds. No murmur heard. Pulmonary:     Effort: Pulmonary effort is normal. No respiratory distress.     Breath sounds: Normal breath sounds.  Abdominal:     General: Bowel sounds are normal. There is no distension.     Palpations: Abdomen is soft. There is no mass.     Tenderness: There is no abdominal tenderness. There is no guarding or rebound.  Genitourinary:    Vagina: Normal.  Musculoskeletal:        General: Normal range of motion.     Cervical back: Normal range of motion and neck supple.     Right lower leg: No edema.     Left lower leg: No edema.  Lymphadenopathy:     Cervical: No cervical adenopathy.  Skin:    General: Skin is warm and dry.     Findings: No erythema or rash.  Neurological:     Mental Status: She is alert and oriented to person, place, and time.     Cranial Nerves: No cranial nerve deficit.     Deep Tendon Reflexes: Reflexes are normal and symmetric.  Psychiatric:        Mood and Affect: Mood normal.         Behavior: Behavior normal.        Thought Content: Thought content normal.        Judgment: Judgment normal.     BP 130/80 (BP Location: Right Arm, Patient Position: Sitting, Cuff Size: Large)   Pulse 66   Temp 98.2 F (36.8 C) (Oral)   Resp 16   Ht 5' 6 (1.676 m)   Wt 165 lb 6.4 oz (75 kg)   SpO2 98%   BMI 26.70 kg/m  Wt Readings from Last 3 Encounters:  11/14/23 165 lb 6.4 oz (75 kg)  06/14/23 165 lb 9.6 oz (75.1 kg)  10/22/22 186 lb 9.6 oz (84.6 kg)    Diabetic Foot Exam - Simple   No data filed    Lab Results  Component Value Date   WBC 6.4 11/14/2023   HGB 13.7 11/14/2023   HCT 40.7 11/14/2023   PLT 237.0 11/14/2023   GLUCOSE 90 11/14/2023   CHOL 220 (H) 11/14/2023   TRIG 156.0 (H) 11/14/2023   HDL 55.50 11/14/2023   LDLCALC 133 (H) 11/14/2023   ALT 15 11/14/2023   AST 16 11/14/2023   NA 138 11/14/2023   K 3.7 11/14/2023   CL 98 11/14/2023   CREATININE 0.97 11/14/2023   BUN 24 (H) 11/14/2023   CO2 31 11/14/2023   TSH 2.79 11/14/2023   INR 0.98 11/24/2014    Lab Results  Component Value Date   TSH 2.79 11/14/2023   Lab Results  Component Value Date   WBC 6.4 11/14/2023   HGB 13.7 11/14/2023   HCT 40.7 11/14/2023   MCV 91.7 11/14/2023   PLT 237.0 11/14/2023   Lab Results  Component Value Date   NA 138 11/14/2023   K 3.7 11/14/2023   CO2 31 11/14/2023   GLUCOSE 90 11/14/2023   BUN 24 (H) 11/14/2023   CREATININE 0.97 11/14/2023   BILITOT 0.3 11/14/2023   ALKPHOS 65 11/14/2023  AST 16 11/14/2023   ALT 15 11/14/2023   PROT 6.4 11/14/2023   ALBUMIN 4.3 11/14/2023   CALCIUM 9.6 11/14/2023   ANIONGAP 8 03/25/2016   GFR 62.51 11/14/2023   Lab Results  Component Value Date   CHOL 220 (H) 11/14/2023   Lab Results  Component Value Date   HDL 55.50 11/14/2023   Lab Results  Component Value Date   LDLCALC 133 (H) 11/14/2023   Lab Results  Component Value Date   TRIG 156.0 (H) 11/14/2023   Lab Results  Component Value Date    CHOLHDL 4 11/14/2023   No results found for: HGBA1C     Assessment & Plan:  Preventative health care Assessment & Plan: Ghm utd Check labs  See AVS  Health Maintenance  Topic Date Due   Pneumococcal Vaccine: 50+ Years (1 of 1 - PCV) Never done   Colonoscopy  07/14/2023   COVID-19 Vaccine (3 - 2025-26 season) 11/30/2023 (Originally 10/31/2023)   Mammogram  06/13/2024   DTaP/Tdap/Td (3 - Td or Tdap) 11/23/2024   Influenza Vaccine  Completed   Hepatitis C Screening  Completed   HIV Screening  Completed   Zoster Vaccines- Shingrix  Completed   Hepatitis B Vaccines 19-59 Average Risk  Aged Out   HPV VACCINES  Aged Out   Meningococcal B Vaccine  Aged Out     Orders: -     CBC with Differential/Platelet -     Comprehensive metabolic panel with GFR -     Lipid panel -     TSH -     VITAMIN D  25 Hydroxy (Vit-D Deficiency, Fractures)  Need for influenza vaccination -     Flu vaccine trivalent PF, 6mos and older(Flulaval,Afluria,Fluarix,Fluzone)  Depression with anxiety -     buPROPion  HCl ER (XL); Take 1 tablet (150 mg total) by mouth daily.  Dispense: 90 tablet; Refill: 3  Viral URI with cough -     Fluticasone  Propionate; Place 2 sprays into both nostrils daily.  Dispense: 16 g; Refill: 11  Hyperlipidemia LDL goal <100 -     Simvastatin ; Take 1 tablet (20 mg total) by mouth at bedtime.  Dispense: 90 tablet; Refill: 3  Edema of extremities -     Triamterene -HCTZ; Take 1 tablet by mouth daily.  Dispense: 90 tablet; Refill: 3  B12 deficiency -     Vitamin B12    Dominyck Reser R Lowne Chase, DO

## 2023-11-15 LAB — CBC WITH DIFFERENTIAL/PLATELET
Basophils Absolute: 0.1 K/uL (ref 0.0–0.1)
Basophils Relative: 0.8 % (ref 0.0–3.0)
Eosinophils Absolute: 0.2 K/uL (ref 0.0–0.7)
Eosinophils Relative: 3.1 % (ref 0.0–5.0)
HCT: 40.7 % (ref 36.0–46.0)
Hemoglobin: 13.7 g/dL (ref 12.0–15.0)
Lymphocytes Relative: 26 % (ref 12.0–46.0)
Lymphs Abs: 1.7 K/uL (ref 0.7–4.0)
MCHC: 33.7 g/dL (ref 30.0–36.0)
MCV: 91.7 fl (ref 78.0–100.0)
Monocytes Absolute: 0.3 K/uL (ref 0.1–1.0)
Monocytes Relative: 5.1 % (ref 3.0–12.0)
Neutro Abs: 4.2 K/uL (ref 1.4–7.7)
Neutrophils Relative %: 65 % (ref 43.0–77.0)
Platelets: 237 K/uL (ref 150.0–400.0)
RBC: 4.44 Mil/uL (ref 3.87–5.11)
RDW: 13.4 % (ref 11.5–15.5)
WBC: 6.4 K/uL (ref 4.0–10.5)

## 2023-11-15 LAB — TSH: TSH: 2.79 u[IU]/mL (ref 0.35–5.50)

## 2023-11-15 LAB — COMPREHENSIVE METABOLIC PANEL WITH GFR
ALT: 15 U/L (ref 0–35)
AST: 16 U/L (ref 0–37)
Albumin: 4.3 g/dL (ref 3.5–5.2)
Alkaline Phosphatase: 65 U/L (ref 39–117)
BUN: 24 mg/dL — ABNORMAL HIGH (ref 6–23)
CO2: 31 meq/L (ref 19–32)
Calcium: 9.6 mg/dL (ref 8.4–10.5)
Chloride: 98 meq/L (ref 96–112)
Creatinine, Ser: 0.97 mg/dL (ref 0.40–1.20)
GFR: 62.51 mL/min (ref >60.00)
Glucose, Bld: 90 mg/dL (ref 70–99)
Potassium: 3.7 meq/L (ref 3.5–5.1)
Sodium: 138 meq/L (ref 135–145)
Total Bilirubin: 0.3 mg/dL (ref 0.2–1.2)
Total Protein: 6.4 g/dL (ref 6.0–8.3)

## 2023-11-15 LAB — LIPID PANEL
Cholesterol: 220 mg/dL — ABNORMAL HIGH (ref 0–200)
HDL: 55.5 mg/dL (ref >39.00)
LDL Cholesterol: 133 mg/dL — ABNORMAL HIGH (ref 0–99)
NonHDL: 164.67
Total CHOL/HDL Ratio: 4
Triglycerides: 156 mg/dL — ABNORMAL HIGH (ref 0.0–149.0)
VLDL: 31.2 mg/dL (ref 0.0–40.0)

## 2023-11-15 LAB — VITAMIN D 25 HYDROXY (VIT D DEFICIENCY, FRACTURES): VITD: 42.78 ng/mL (ref 30.00–100.00)

## 2023-11-15 LAB — VITAMIN B12: Vitamin B-12: 1270 pg/mL — ABNORMAL HIGH (ref 211–911)

## 2023-11-18 ENCOUNTER — Ambulatory Visit: Payer: Self-pay | Admitting: Family Medicine

## 2023-11-18 ENCOUNTER — Other Ambulatory Visit: Payer: Self-pay | Admitting: Family Medicine

## 2023-11-18 DIAGNOSIS — E785 Hyperlipidemia, unspecified: Secondary | ICD-10-CM

## 2023-11-19 NOTE — Assessment & Plan Note (Signed)
 Ghm utd Check labs  See AVS  Health Maintenance  Topic Date Due   Pneumococcal Vaccine: 50+ Years (1 of 1 - PCV) Never done   Colonoscopy  07/14/2023   COVID-19 Vaccine (3 - 2025-26 season) 11/30/2023 (Originally 10/31/2023)   Mammogram  06/13/2024   DTaP/Tdap/Td (3 - Td or Tdap) 11/23/2024   Influenza Vaccine  Completed   Hepatitis C Screening  Completed   HIV Screening  Completed   Zoster Vaccines- Shingrix  Completed   Hepatitis B Vaccines 19-59 Average Risk  Aged Out   HPV VACCINES  Aged Out   Meningococcal B Vaccine  Aged Out

## 2023-11-23 MED ORDER — PRAVASTATIN SODIUM 40 MG PO TABS: 40.0000 mg | ORAL_TABLET | Freq: Every day | ORAL | 0 refills | Status: AC

## 2023-11-25 ENCOUNTER — Other Ambulatory Visit: Payer: Self-pay | Admitting: Family Medicine

## 2023-11-25 DIAGNOSIS — F418 Other specified anxiety disorders: Secondary | ICD-10-CM

## 2023-11-25 MED ORDER — SIMVASTATIN 20 MG PO TABS: 20.0000 mg | ORAL_TABLET | Freq: Every day | ORAL | 1 refills | Status: AC

## 2023-11-25 NOTE — Addendum Note (Signed)
 Addended by: ELOUISE POWELL HERO on: 11/25/2023 09:15 AM   Modules accepted: Orders

## 2024-01-06 ENCOUNTER — Ambulatory Visit: Payer: Self-pay

## 2024-01-06 ENCOUNTER — Telehealth: Payer: Self-pay | Admitting: Family Medicine

## 2024-01-06 NOTE — Telephone Encounter (Signed)
 Patient requesting refill of Celexa .  States that no discussion ever took place between herself and Dr. Antonio about discontinuing this medication.

## 2024-01-06 NOTE — Telephone Encounter (Signed)
 FYI Only or Action Required?: Action required by provider: update on patient condition and patient request refill of celexa , refill request sent.  Patient was last seen in primary care on 11/14/2023 by Antonio Meth, Jamee SAUNDERS, DO.  Called Nurse Triage reporting Palpitations.  Symptoms began several days ago.  Interventions attempted: Rest, hydration, or home remedies.  Symptoms are: unchanged.  Triage Disposition: Home Care  Patient/caregiver understands and will follow disposition?: Yes  Copied from CRM [id number]. Topic: Clinical - Red Word Triage >> Jan 06, 2024  4:53 PM Joesph NOVAK wrote: Red Word that prompted transfer to Nurse Triage:  Patient is having withdrawals from not having her medication, celexa .  Her chest is fluttering. Reason for Disposition  Palpitations  Answer Assessment - Initial Assessment Questions 1. DESCRIPTION: Please describe your heart rate or heartbeat that you are having (e.g., fast/slow, regular/irregular, skipped or extra beats, palpitations)     Patient experiences a heaviness in her chest and feels the need to take a deep breath 2. ONSET: When did it start? (e.g., minutes, hours, days)      Three days ago 3. DURATION: How long does it last (e.g., seconds, minutes, hours)     A couple of minutes at a time 4. PATTERN Does it come and go, or has it been constant since it started?  Does it get worse with exertion?   Are you feeling it now?     Comes and goes  6. HEART RATE: Can you tell me your heart rate? How many beats in 15 seconds?  Note: Not all patients can do this.       95 per apple watch during palpitations 7. RECURRENT SYMPTOM: Have you ever had this before? If Yes, ask: When was the last time? and What happened that time?      denies 8. CAUSE: What do you think is causing the palpitations?     Patient has not had celexa  in the past two weeks.  Last refill request was declines 9. CARDIAC HISTORY: Do you have any history  of heart disease? (e.g., heart attack, angina, bypass surgery, angioplasty, arrhythmia)      HTN, not medicated 10. OTHER SYMPTOMS: Do you have any other symptoms? (e.g., dizziness, chest pain, sweating, difficulty breathing)       denies  Protocols used: Heart Rate and Heartbeat Questions-A-AH

## 2024-01-09 MED ORDER — CITALOPRAM HYDROBROMIDE 20 MG PO TABS: 20.0000 mg | ORAL_TABLET | Freq: Every day | ORAL | 3 refills | Status: AC

## 2024-01-09 NOTE — Telephone Encounter (Signed)
 Antonio Cyndee Jamee JONELLE, DO to Me  Elouise Powell HERO, CMA (Selected Message)     01/09/24  1:25 PM Its in the history---- please send celexa  20 mg #90  3 refills

## 2024-01-09 NOTE — Telephone Encounter (Signed)
Please advise? No longer on med list.  

## 2024-01-09 NOTE — Telephone Encounter (Signed)
 LMOM informing Pt that citalopram  has been refilled. Instructed to come in for visit if palpitations do not improve once she starts back on citalopram .

## 2024-01-09 NOTE — Telephone Encounter (Signed)
 Rx sent.

## 2024-01-09 NOTE — Telephone Encounter (Signed)
 See other telephone note.

## 2024-03-07 ENCOUNTER — Other Ambulatory Visit: Payer: Self-pay | Admitting: Family Medicine

## 2024-03-07 DIAGNOSIS — N390 Urinary tract infection, site not specified: Secondary | ICD-10-CM
